# Patient Record
Sex: Male | Born: 1980 | Race: White | Hispanic: No | Marital: Married | Smoking: Never smoker
Health system: Southern US, Community
[De-identification: ages and names within clinical notes are randomized; demographics above are authoritative.]

---
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
## 2003-08-15 ENCOUNTER — Emergency Department (HOSPITAL_COMMUNITY): Admission: EM | Admit: 2003-08-15 | Discharge: 2003-08-16 | Payer: Self-pay | Admitting: Emergency Medicine

## 2003-08-16 ENCOUNTER — Encounter: Payer: Self-pay | Admitting: Emergency Medicine

## 2003-08-18 ENCOUNTER — Ambulatory Visit (HOSPITAL_BASED_OUTPATIENT_CLINIC_OR_DEPARTMENT_OTHER): Admission: RE | Admit: 2003-08-18 | Discharge: 2003-08-18 | Payer: Self-pay | Admitting: Orthopedic Surgery

## 2004-03-31 ENCOUNTER — Emergency Department (HOSPITAL_COMMUNITY): Admission: EM | Admit: 2004-03-31 | Discharge: 2004-03-31 | Payer: Self-pay | Admitting: Emergency Medicine

## 2015-04-14 ENCOUNTER — Emergency Department (HOSPITAL_COMMUNITY): Payer: BC Managed Care – PPO

## 2015-04-14 ENCOUNTER — Encounter (HOSPITAL_COMMUNITY): Payer: Self-pay | Admitting: *Deleted

## 2015-04-14 ENCOUNTER — Emergency Department (HOSPITAL_COMMUNITY)
Admission: EM | Admit: 2015-04-14 | Discharge: 2015-04-14 | Disposition: A | Discharge status: Home / Self Care | Payer: BC Managed Care – PPO | Attending: Emergency Medicine | Admitting: Emergency Medicine

## 2015-04-14 DIAGNOSIS — R002 Palpitations: Secondary | ICD-10-CM | POA: Insufficient documentation

## 2015-04-14 DIAGNOSIS — R079 Chest pain, unspecified: Secondary | ICD-10-CM | POA: Diagnosis present

## 2015-04-14 LAB — BASIC METABOLIC PANEL
Anion gap: 8 (ref 5–15)
BUN: 11 mg/dL (ref 6–23)
CALCIUM: 9.2 mg/dL (ref 8.4–10.5)
CO2: 27 mmol/L (ref 19–32)
CREATININE: 1.19 mg/dL (ref 0.50–1.35)
Chloride: 104 mmol/L (ref 96–112)
GFR calc Af Amer: >90 mL/min (ref >90)
GFR calc non Af Amer: 79 mL/min — ABNORMAL LOW (ref >90)
GLUCOSE: 133 mg/dL — AB (ref 70–99)
POTASSIUM: 3.6 mmol/L (ref 3.5–5.1)
SODIUM: 139 mmol/L (ref 135–145)

## 2015-04-14 LAB — CBC
HEMATOCRIT: 44.7 % (ref 39.0–52.0)
HEMOGLOBIN: 15.1 g/dL (ref 13.0–17.0)
MCH: 28.2 pg (ref 26.0–34.0)
MCHC: 33.8 g/dL (ref 30.0–36.0)
MCV: 83.4 fL (ref 78.0–100.0)
Platelets: 188 10*3/uL (ref 150–400)
RBC: 5.36 MIL/uL (ref 4.22–5.81)
RDW: 13.7 % (ref 11.5–15.5)
WBC: 8 10*3/uL (ref 4.0–10.5)

## 2015-04-14 LAB — I-STAT TROPONIN, ED: TROPONIN I, POC: 0 ng/mL (ref 0.00–0.08)

## 2015-04-14 NOTE — ED Notes (Signed)
Pt in c/o palpitations and chest tightness since Monday, states symptoms have been going on since his father passed away Monday evening, denies other complaints, no distress noted

## 2015-04-14 NOTE — ED Notes (Signed)
Pt verbalized understanding of d/c instructions and has no further questions.  

## 2015-04-14 NOTE — ED Provider Notes (Signed)
CSN: [id number]     Arrival date & time 04/14/15  [id number] History   First MD Initiated Contact with Patient 04/14/15 0557     Chief Complaint  Patient presents with  . Chest Pain     (Consider location/radiation/quality/duration/timing/severity/associated sxs/prior Treatment) Patient is a 34 y.o. male presenting with chest pain. The history is provided by the patient.  Chest Pain He actually came in because he felt his heart racing. This occurred while he was lying in bed and lasted about 45 minutes ending just about the time he arrived in the ED. He did have a brief episode of a very mild discomfort in the right parasternal area, and he rated that pain at 1/10. There is no dyspnea, nausea, diaphoresis. He did have a fair amount of stress 2 days ago with his father dying and the drank a fifth of liquor that night. He is not in the alcohol since then and is a nonsmoker. He states that his father did have heart disease which started when he was in his mid 3630s. There is no known history of diabetes, hyperlipidemia, hypertension.  History reviewed. No pertinent past medical history. History reviewed. No pertinent past surgical history. History reviewed. No pertinent family history. History  Substance Use Topics  . Smoking status: Never Smoker   . Smokeless tobacco: Not on file  . Alcohol Use: Not on file    Review of Systems  Cardiovascular: Positive for chest pain.  All other systems reviewed and are negative.     Allergies  Review of patient's allergies indicates no known allergies.  Home Medications   Prior to Admission medications   Not on File   BP 143/82 mmHg  Pulse 74  Temp(Src) 97.8 F (36.6 C) (Oral)  Resp 12  Wt 285 lb (129.275 kg)  SpO2 98% Physical Exam  Nursing note and vitals reviewed.  34 year old male, resting comfortably and in no acute distress. Vital signs are normal. Oxygen saturation is 100%, which is normal. Head is normocephalic and atraumatic.  PERRLA, EOMI. Oropharynx is clear. Neck is nontender and supple without adenopathy or JVD. Back is nontender and there is no CVA tenderness. Lungs are clear without rales, wheezes, or rhonchi. Chest is nontender. Heart has regular rate and rhythm without murmur. Abdomen is soft, flat, nontender without masses or hepatosplenomegaly and peristalsis is normoactive. Extremities have no cyanosis or edema, full range of motion is present. Skin is warm and dry without rash. Neurologic: Mental status is normal, cranial nerves are intact, there are no motor or sensory deficits.  ED Course  Procedures (including critical care time) Labs Review Results for orders placed or performed during the hospital encounter of 04/14/15  CBC  Result Value Ref Range   WBC 8.0 4.0 - 10.5 K/uL   RBC 5.36 4.22 - 5.81 MIL/uL   Hemoglobin 15.1 13.0 - 17.0 g/dL   HCT 11.944.7 14.739.0 - 82.952.0 %   MCV 83.4 78.0 - 100.0 fL   MCH 28.2 26.0 - 34.0 pg   MCHC 33.8 30.0 - 36.0 g/dL   RDW 56.213.7 13.011.5 - 86.515.5 %   Platelets 188 150 - 400 K/uL  Basic metabolic panel  Result Value Ref Range   Sodium 139 135 - 145 mmol/L   Potassium 3.6 3.5 - 5.1 mmol/L   Chloride 104 96 - 112 mmol/L   CO2 27 19 - 32 mmol/L   Glucose, Bld 133 (H) 70 - 99 mg/dL   BUN 11 6 - 23  mg/dL   Creatinine, Ser 9.14 0.50 - 1.35 mg/dL   Calcium 9.2 8.4 - 78.2 mg/dL   GFR calc non Af Amer 79 (L) >90 mL/min   GFR calc Af Amer >90 >90 mL/min   Anion gap 8 5 - 15  I-stat troponin, ED (not at [facility])  Result Value Ref Range   Troponin i, poc 0.00 0.00 - 0.08 ng/mL   Comment 3           Imaging Review Dg Chest 2 View  04/14/2015   CLINICAL DATA:  Chest tightness and pressure, with shortness of breath, acute onset. Initial encounter.  EXAM: CHEST  2 VIEW  COMPARISON:  None.  FINDINGS: The lungs are well-aerated and clear. There is no evidence of focal opacification, pleural effusion or pneumothorax.  The heart is normal in size; the mediastinal contour is within  normal limits. No acute osseous abnormalities are seen.  IMPRESSION: No acute cardiopulmonary process seen.   Electronically Signed   By: Roanna Raider M.D.   On: 04/14/2015 03:50     EKG Interpretation   Date/Time:  Wednesday April 14 2015 03:15:44 EDT Ventricular Rate:  85 PR Interval:  160 QRS Duration: 98 QT Interval:  362 QTC Calculation: 430 R Axis:   80 Text Interpretation:  Normal sinus rhythm with sinus arrhythmia Normal ECG  No old tracing to compare Confirmed by [facility]  MD, Wilbert Schouten ([id number]) on  04/14/2015 5:58:20 AM      MDM   Final diagnoses:  Palpitations    Palpitations of uncertain cause. ECG and monitor have only shown normal sinus rhythm. With recent alcohol consumption, consider possibility of holiday heart syndrome. He also may have had an episode of paroxysmal supraventricular tachycardia. Based on family history of premature coronary atherosclerosis, he does need to pay attention to risk factors and I have talked with him about weight reduction and exercise. No further investigation of palpitations is warranted currently. He is noted to have elevated blood glucose and this will need to be followed as an outpatient. He does not have a PCP and he is urged to get one so that he can have other risk factors such as lipid profile checked.    Dione Booze, MD 04/14/15 [id number]

## 2015-04-14 NOTE — Discharge Instructions (Signed)
Make an appointment for a physical. You need to have your cholesterol and triglycerides checked. Also, your blood sugar was slightly elevated today (133). This needs to be watched to make sure you do not develop diabetes.  Palpitations A palpitation is the feeling that your heartbeat is irregular or is faster than normal. It may feel like your heart is fluttering or skipping a beat. Palpitations are usually not a serious problem. However, in some cases, you may need further medical evaluation. CAUSES  Palpitations can be caused by:  Smoking.  Caffeine or other stimulants, such as diet pills or energy drinks.  Alcohol.  Stress and anxiety.  Strenuous physical activity.  Fatigue.  Certain medicines.  Heart disease, especially if you have a history of irregular heart rhythms (arrhythmias), such as atrial fibrillation, atrial flutter, or supraventricular tachycardia.  An improperly working pacemaker or defibrillator. DIAGNOSIS  To find the cause of your palpitations, your health care provider will take your medical history and perform a physical exam. Your health care provider may also have you take a test called an ambulatory electrocardiogram (ECG). An ECG records your heartbeat patterns over a 24-hour period. You may also have other tests, such as:  Transthoracic echocardiogram (TTE). During echocardiography, sound waves are used to evaluate how blood flows through your heart.  Transesophageal echocardiogram (TEE).  Cardiac monitoring. This allows your health care provider to monitor your heart rate and rhythm in real time.  Holter monitor. This is a portable device that records your heartbeat and can help diagnose heart arrhythmias. It allows your health care provider to track your heart activity for several days, if needed.  Stress tests by exercise or by giving medicine that makes the heart beat faster. TREATMENT  Treatment of palpitations depends on the cause of your symptoms  and can vary greatly. Most cases of palpitations do not require any treatment other than time, relaxation, and monitoring your symptoms. Other causes, such as atrial fibrillation, atrial flutter, or supraventricular tachycardia, usually require further treatment. HOME CARE INSTRUCTIONS   Avoid:  Caffeinated coffee, tea, soft drinks, diet pills, and energy drinks.  Chocolate.  Alcohol.  Stop smoking if you smoke.  Reduce your stress and anxiety. Things that can help you relax include:  A method of controlling things in your body, such as your heartbeats, with your mind (biofeedback).  Yoga.  Meditation.  Physical activity such as swimming, jogging, or walking.  Get plenty of rest and sleep. SEEK MEDICAL CARE IF:   You continue to have a fast or irregular heartbeat beyond 24 hours.  Your palpitations occur more often. SEEK IMMEDIATE MEDICAL CARE IF:  You have chest pain or shortness of breath.  You have a severe headache.  You feel dizzy or you faint. MAKE SURE YOU:  Understand these instructions.  Will watch your condition.  Will get help right away if you are not doing well or get worse. Document Released: 12/01/2000 Document Revised: 12/09/2013 Document Reviewed: 02/02/2012 [facility] Patient Information 2015 [place], [place]. This information is not intended to replace advice given to you by your health care provider. Make sure you discuss any questions you have with your health care provider.

## 2020-04-06 ENCOUNTER — Inpatient Hospital Stay (HOSPITAL_COMMUNITY)
Admission: EM | Admit: 2020-04-06 | Discharge: 2020-04-10 | DRG: 177 | Disposition: A | Discharge status: Home / Self Care | Payer: BC Managed Care – PPO | Source: Ambulatory Visit | Attending: Internal Medicine | Admitting: Internal Medicine

## 2020-04-06 ENCOUNTER — Encounter (HOSPITAL_COMMUNITY): Payer: Self-pay | Admitting: Emergency Medicine

## 2020-04-06 ENCOUNTER — Other Ambulatory Visit: Payer: Self-pay

## 2020-04-06 ENCOUNTER — Emergency Department (HOSPITAL_COMMUNITY): Payer: BC Managed Care – PPO

## 2020-04-06 DIAGNOSIS — J9601 Acute respiratory failure with hypoxia: Secondary | ICD-10-CM | POA: Diagnosis present

## 2020-04-06 DIAGNOSIS — Z6838 Body mass index (BMI) 38.0-38.9, adult: Secondary | ICD-10-CM

## 2020-04-06 DIAGNOSIS — E669 Obesity, unspecified: Secondary | ICD-10-CM | POA: Diagnosis not present

## 2020-04-06 DIAGNOSIS — U071 COVID-19: Secondary | ICD-10-CM | POA: Diagnosis present

## 2020-04-06 DIAGNOSIS — R7401 Elevation of levels of liver transaminase levels: Secondary | ICD-10-CM | POA: Diagnosis not present

## 2020-04-06 DIAGNOSIS — J1282 Pneumonia due to coronavirus disease 2019: Secondary | ICD-10-CM | POA: Diagnosis present

## 2020-04-06 HISTORY — DX: COVID-19: U07.1

## 2020-04-06 LAB — CBC WITH DIFFERENTIAL/PLATELET
Abs Immature Granulocytes: 0.05 10*3/uL (ref 0.00–0.07)
Basophils Absolute: 0 10*3/uL (ref 0.0–0.1)
Basophils Relative: 0 %
Eosinophils Absolute: 0 10*3/uL (ref 0.0–0.5)
Eosinophils Relative: 0 %
HCT: 43.8 % (ref 39.0–52.0)
Hemoglobin: 14.3 g/dL (ref 13.0–17.0)
Immature Granulocytes: 1 %
Lymphocytes Relative: 12 %
Lymphs Abs: 0.6 10*3/uL — ABNORMAL LOW (ref 0.7–4.0)
MCH: 27.2 pg (ref 26.0–34.0)
MCHC: 32.6 g/dL (ref 30.0–36.0)
MCV: 83.4 fL (ref 80.0–100.0)
Monocytes Absolute: 0.2 10*3/uL (ref 0.1–1.0)
Monocytes Relative: 4 %
Neutro Abs: 4.4 10*3/uL (ref 1.7–7.7)
Neutrophils Relative %: 83 %
Platelets: 190 10*3/uL (ref 150–400)
RBC: 5.25 MIL/uL (ref 4.22–5.81)
RDW: 13.7 % (ref 11.5–15.5)
WBC: 5.3 10*3/uL (ref 4.0–10.5)
nRBC: 0 % (ref 0.0–0.2)

## 2020-04-06 LAB — HEPATIC FUNCTION PANEL
ALT: 45 U/L — ABNORMAL HIGH (ref 0–44)
AST: 37 U/L (ref 15–41)
Albumin: 2.6 g/dL — ABNORMAL LOW (ref 3.5–5.0)
Alkaline Phosphatase: 60 U/L (ref 38–126)
Bilirubin, Direct: 0.3 mg/dL — ABNORMAL HIGH (ref 0.0–0.2)
Indirect Bilirubin: 0.5 mg/dL (ref 0.3–0.9)
Total Bilirubin: 0.8 mg/dL (ref 0.3–1.2)
Total Protein: 6.7 g/dL (ref 6.5–8.1)

## 2020-04-06 LAB — FIBRINOGEN: Fibrinogen: 689 mg/dL — ABNORMAL HIGH (ref 210–475)

## 2020-04-06 LAB — LACTATE DEHYDROGENASE: LDH: 409 U/L — ABNORMAL HIGH (ref 98–192)

## 2020-04-06 LAB — BASIC METABOLIC PANEL
Anion gap: 11 (ref 5–15)
BUN: 10 mg/dL (ref 6–20)
CO2: 25 mmol/L (ref 22–32)
Calcium: 8.3 mg/dL — ABNORMAL LOW (ref 8.9–10.3)
Chloride: 100 mmol/L (ref 98–111)
Creatinine, Ser: 0.85 mg/dL (ref 0.61–1.24)
GFR calc Af Amer: >60 mL/min (ref >60)
GFR calc non Af Amer: >60 mL/min (ref >60)
Glucose, Bld: 119 mg/dL — ABNORMAL HIGH (ref 70–99)
Potassium: 3.3 mmol/L — ABNORMAL LOW (ref 3.5–5.1)
Sodium: 136 mmol/L (ref 135–145)

## 2020-04-06 LAB — PROCALCITONIN: Procalcitonin: 0.12 ng/mL

## 2020-04-06 LAB — CBC
HCT: 45 % (ref 39.0–52.0)
Hemoglobin: 14.7 g/dL (ref 13.0–17.0)
MCH: 27.1 pg (ref 26.0–34.0)
MCHC: 32.7 g/dL (ref 30.0–36.0)
MCV: 83 fL (ref 80.0–100.0)
Platelets: 201 10*3/uL (ref 150–400)
RBC: 5.42 MIL/uL (ref 4.22–5.81)
RDW: 13.8 % (ref 11.5–15.5)
WBC: 5.7 10*3/uL (ref 4.0–10.5)
nRBC: 0 % (ref 0.0–0.2)

## 2020-04-06 LAB — HIV ANTIBODY (ROUTINE TESTING W REFLEX): HIV Screen 4th Generation wRfx: NONREACTIVE

## 2020-04-06 LAB — D-DIMER, QUANTITATIVE: D-Dimer, Quant: 1.09 ug/mL-FEU — ABNORMAL HIGH (ref 0.00–0.50)

## 2020-04-06 LAB — FERRITIN: Ferritin: 1378 ng/mL — ABNORMAL HIGH (ref 24–336)

## 2020-04-06 LAB — POC SARS CORONAVIRUS 2 AG -  ED: SARS Coronavirus 2 Ag: NEGATIVE

## 2020-04-06 LAB — LACTIC ACID, PLASMA: Lactic Acid, Venous: 1.6 mmol/L (ref 0.5–1.9)

## 2020-04-06 LAB — C-REACTIVE PROTEIN: CRP: 10.1 mg/dL — ABNORMAL HIGH (ref <1.0)

## 2020-04-06 MED ORDER — BISACODYL 5 MG PO TBEC: 5.0000 mg | DELAYED_RELEASE_TABLET | Freq: Every day | ORAL | Status: DC | PRN

## 2020-04-06 MED ORDER — GUAIFENESIN-DM 100-10 MG/5ML PO SYRP
10.0000 mL | ORAL_SOLUTION | ORAL | Status: DC | PRN
  Administered 2020-04-07: 10 mL via ORAL
  Filled 2020-04-06 (×3): qty 10

## 2020-04-06 MED ORDER — ENOXAPARIN SODIUM 40 MG/0.4ML ~~LOC~~ SOLN
40.0000 mg | SUBCUTANEOUS | Status: DC
  Administered 2020-04-06: 16:00:00 40 mg via SUBCUTANEOUS
  Filled 2020-04-06: qty 0.4

## 2020-04-06 MED ORDER — SODIUM CHLORIDE 0.9% FLUSH
3.0000 mL | Freq: Two times a day (BID) | INTRAVENOUS | Status: DC
  Administered 2020-04-06 – 2020-04-09 (×7): 3 mL via INTRAVENOUS

## 2020-04-06 MED ORDER — DEXAMETHASONE SODIUM PHOSPHATE 10 MG/ML IJ SOLN
6.0000 mg | INTRAMUSCULAR | Status: DC
  Administered 2020-04-06: 16:00:00 6 mg via INTRAVENOUS
  Filled 2020-04-06: qty 1

## 2020-04-06 MED ORDER — SODIUM CHLORIDE 0.9 % IV SOLN: 250.0000 mL | INTRAVENOUS | Status: DC | PRN

## 2020-04-06 MED ORDER — ALBUTEROL SULFATE HFA 108 (90 BASE) MCG/ACT IN AERS
2.0000 | INHALATION_SPRAY | RESPIRATORY_TRACT | Status: DC | PRN
  Administered 2020-04-06 – 2020-04-08 (×4): 2 via RESPIRATORY_TRACT
  Filled 2020-04-06: qty 6.7

## 2020-04-06 MED ORDER — HYDROCOD POLST-CPM POLST ER 10-8 MG/5ML PO SUER
5.0000 mL | Freq: Two times a day (BID) | ORAL | Status: DC | PRN
  Administered 2020-04-07: 5 mL via ORAL
  Filled 2020-04-06: qty 5

## 2020-04-06 MED ORDER — ONDANSETRON HCL 4 MG/2ML IJ SOLN: 4.0000 mg | Freq: Four times a day (QID) | INTRAMUSCULAR | Status: DC | PRN

## 2020-04-06 MED ORDER — SODIUM CHLORIDE 0.9 % IV SOLN
100.0000 mg | Freq: Every day | INTRAVENOUS | Status: AC
  Administered 2020-04-07 – 2020-04-10 (×4): 100 mg via INTRAVENOUS
  Filled 2020-04-06 (×4): qty 20

## 2020-04-06 MED ORDER — ACETAMINOPHEN 325 MG PO TABS
650.0000 mg | ORAL_TABLET | Freq: Four times a day (QID) | ORAL | Status: DC | PRN
  Administered 2020-04-06: 650 mg via ORAL
  Filled 2020-04-06: qty 2

## 2020-04-06 MED ORDER — OXYCODONE HCL 5 MG PO TABS: 5.0000 mg | ORAL_TABLET | ORAL | Status: DC | PRN

## 2020-04-06 MED ORDER — POLYETHYLENE GLYCOL 3350 17 G PO PACK: 17.0000 g | PACK | Freq: Every day | ORAL | Status: DC | PRN

## 2020-04-06 MED ORDER — SODIUM CHLORIDE 0.9% FLUSH
3.0000 mL | INTRAVENOUS | Status: DC | PRN
  Administered 2020-04-06: 16:00:00 3 mL via INTRAVENOUS

## 2020-04-06 MED ORDER — SODIUM CHLORIDE 0.9% FLUSH
3.0000 mL | Freq: Two times a day (BID) | INTRAVENOUS | Status: DC
  Administered 2020-04-06 – 2020-04-09 (×8): 3 mL via INTRAVENOUS

## 2020-04-06 MED ORDER — ONDANSETRON HCL 4 MG PO TABS: 4.0000 mg | ORAL_TABLET | Freq: Four times a day (QID) | ORAL | Status: DC | PRN

## 2020-04-06 MED ORDER — FLEET ENEMA 7-19 GM/118ML RE ENEM: 1.0000 | ENEMA | Freq: Once | RECTAL | Status: DC | PRN

## 2020-04-06 MED ORDER — SODIUM CHLORIDE 0.9 % IV SOLN
200.0000 mg | Freq: Once | INTRAVENOUS | Status: AC
  Administered 2020-04-06: 18:00:00 200 mg via INTRAVENOUS
  Filled 2020-04-06: qty 40

## 2020-04-06 NOTE — Plan of Care (Signed)

## 2020-04-06 NOTE — ED Notes (Signed)
Dr. Ophelia Charter in to eval pt.  No gross changes noted.

## 2020-04-06 NOTE — H&P (Signed)
History and Physical    Jeremy Miles QHU:[id number] DOB: 12-01-1981 DOA: 04/06/2020  PCP: Jeremy Rakes, MD Consultants:  None Patient coming from:  Home - lives with wife and 2 children; NOK: Wife, Jeremy Miles, [phone]  Chief Complaint:  SOB  HPI: Jeremy Miles is a 39 y.o. male with no significant medical history presenting with worsening COVID symptoms.  He reports initial onset of symptoms with fever of 102.8 on 4/11.  He went the following day for COVID testing at a drive-through [facility] and was reported positive on 4/14.  All of last week, he had a severe headache behind his eyes.  The headache eased last weekend but his fever has spiked intermittently throughout.  He thought he was doing better but his wife noted SOB and a crackling noise with his overnight breathing the last few nights.  He was supposed to be released from quarantine on 4/22 but his wife made him go to Urgent Care today.  There, his sats were in the 80s and they sent him to the ER.  He reports that he has not really worn a mask for a year now and that "this is all just so stupid."  His wife reports that he was the first one in their family to get COVID and he is the sickest - fevers, chills, coughing, getting choked, dry mouth.  She had to help him get dressed after a shower because he was so out of breath.  The last few days, his breathing was getting worse at just the littlest things.    ED Course:  COVID + Thursday and increasing SOB since.  High 80s at UC, placed on 4L.  Can't wean down.  Review of Systems: As per HPI; otherwise review of systems reviewed and negative.   Ambulatory Status:  Ambulates without assistance  COVID Vaccine Status:  None   Past Medical History:  Diagnosis Date  . Acute hypoxemic respiratory failure due to COVID-19 [facility]) 04/06/2020    History reviewed. No pertinent surgical history.  Social History   Socioeconomic History  . Marital status: Married    Spouse name: Not on  file  . Number of children: Not on file  . Years of education: Not on file  . Highest education level: Not on file  Occupational History  . Occupation: high Wellsite geologist  Tobacco Use  . Smoking status: Never Smoker  . Smokeless tobacco: Never Used  Substance and Sexual Activity  . Alcohol use: Not Currently  . Drug use: Never  . Sexual activity: Not on file  Other Topics Concern  . Not on file  Social History Narrative  . Not on file   Social Determinants of Health   Financial Resource Strain:   . Difficulty of Paying Living Expenses:   Food Insecurity:   . Worried About Programme researcher, broadcasting/film/video in the Last Year:   . Barista in the Last Year:   Transportation Needs:   . Freight forwarder (Medical):   Marland Kitchen Lack of Transportation (Non-Medical):   Physical Activity:   . Days of Exercise per Week:   . Minutes of Exercise per Session:   Stress:   . Feeling of Stress :   Social Connections:   . Frequency of Communication with Friends and Family:   . Frequency of Social Gatherings with Friends and Family:   . Attends Religious Services:   . Active Member of Clubs or Organizations:   . Attends Club or  Organization Meetings:   Marland Kitchen Marital Status:   Intimate Partner Violence:   . Fear of Current or Ex-Partner:   . Emotionally Abused:   Marland Kitchen Physically Abused:   . Sexually Abused:     No Known Allergies  History reviewed. No pertinent family history.  Prior to Admission medications   Not on File    Physical Exam: Vitals:   04/06/20 1345 04/06/20 1400 04/06/20 1415 04/06/20 1445  BP: 117/86 119/82 115/79 109/76  Pulse: 92 (!) 102 (!) 103 99  Resp: (!) 29 (!) 25 (!) 37 (!) 37  Temp:      TempSrc:      SpO2: 91% 94% (!) 87% 94%  Weight:      Height:         . General:  Appears calm and comfortable and is NAD; somewhat frustrated . Eyes:  PERRL, EOMI, normal lids, iris . ENT:  grossly normal hearing, lips & tongue, mmm; appropriate dentition . Neck:  no  LAD, masses or thyromegaly . Cardiovascular:  RR with mild tachycardia, no m/r/g. No LE edema.  Marland Kitchen Respiratory:   CTA bilaterally with no wheezes/rales/rhonchi, diminished breath sounds in bases.  Mildly increased respiratory effort on 4L [place] O2 with sat 93% . Abdomen:  soft, NT, ND, NABS . Skin:  no rash or induration seen on limited exam . Musculoskeletal:  grossly normal tone BUE/BLE, good ROM, no bony abnormality . Psychiatric:  grossly normal mood and affect, speech fluent and appropriate, AOx3 . Neurologic:  CN 2-12 grossly intact, moves all extremities in coordinated fashion    Radiological Exams on Admission: DG Chest Portable 1 View  Result Date: 04/06/2020 CLINICAL DATA:  Shortness of breath. Positive COVID-19 EXAM: PORTABLE CHEST 1 VIEW COMPARISON:  04/14/2015 FINDINGS: The heart size and mediastinal contours are within normal limits. Low lung volumes. Extensive multifocal bilateral airspace opacities. No large pleural fluid collection or pneumothorax. The visualized skeletal structures are unremarkable. IMPRESSION: Extensive multifocal bilateral airspace opacities compatible with multifocal pneumonia in the setting of COVID-19 infection. Electronically Signed   By: Duanne Guess D.O.   On: 04/06/2020 13:58    EKG: Independently reviewed.  Sinus tachycardia with rate 120; nonspecific ST changes with no evidence of acute ischemia   Labs on Admission: I have personally reviewed the available labs and imaging studies at the time of the admission.  Pertinent labs:   K+ 3.3 Glucose 119 Lactate 1.6 Normal CBC, +lymphopenia POC COVID negative Albumin 2.6 AST 37/ALT 45 LDH 409 Ferritin 1378 CRP 10.1 D-dimer 1.09 Fibrinogen 689 Procalcitonin is pending  COVID [facility]12[facility]:       Assessment/Plan Active Problems:   Acute hypoxemic respiratory failure due to COVID-19 (HCC)   Obesity (BMI 35.0-39.9 without comorbidity)   Acute respiratory failure  with hypoxia -Patient with COVID diagnosed about 10 days ago presenting with SOB and hypoxia to 80s -He does not have a usual home O2 requirement and is currently requiring 4L [place] O2 -COVID POSITIVE -The patient has comorbidities which may increase the risk for ARDS/MODS including:  Obesity -Exam is concerning for development of ARDS/MODS due to respiratory distress, inability to wean O2 below 4L -Pertinent labs concerning for COVID include lymphopenia; mildly increased LFTs; increased LDH; markedly elevated D-dimer (>1); increased ferritin; markedly elevated CRP (>7); increased fibrinogen -CXR with multifocal opacities which may be c/w COVID vs. Multifocal PNA -Will treat with broad-spectrum antibiotics given procalcitonin >0.1.   -Will not treat with broad-spectrum antibiotics  given procalcitonin <0.1 -Will admit for further evaluation, close monitoring, and treatment -Monitor on telemetry x at least 24 hours -At this time, will attempt to avoid use of aerosolized medications and use HFAs instead -Will check daily labs including BMP with Mag, Phos; LFTs; CBC with differential; CRP; ferritin; fibrinogen; D-dimer -Will order steroids and Remdesivir (pharmacy consult) given +COVID test, +CXR, and hypoxia <94% on room air -If the patient shows clinical deterioration, consider transfer to ICU with PCCM consultation -Consider Tocilizumab and/or convalescent plasma if the patient does not stabilize on current treatment or if the patient has marked clinical decompensation; the patient does not appear to require these treatments at this time. -Will attempt to maintain euvolemia to a net negative fluid status -Will ask the patient to maintain an awake prone position for 16+ hours a day, if possible, with a minimum of 2-3 hours at a time -With D-dimer <5, will use standard-dosed Lovenox for DVT prevention -Patient was seen wearing full PPE including: gown, gloves, head cover, N95, and face shield; donning  and doffing was in compliance with current standards.  Obesity -Body mass index is 38.49 kg/m. -Weight loss should be encouraged -Outpatient PCP/bariatric medicine/bariatric surgery f/u encouraged     DVT prophylaxis:  Lovenox  Code Status:  Full - confirmed with patient Family Communication: None present; I spoke with the patient's wife by telephone. Disposition Plan:  The patient is from: home  Anticipated d/c is to: home without [facility] services once his respiratory issues have been resolved.  He may require home O2 at the time of discharge.  Anticipated d/c date will depend on clinical response to treatment, likely between 3 days (with completion of outpatient Remdesivir treatment) and 5 days  Patient is currently: acutely ill Consults called: None  Admission status: Admit - It is my clinical opinion that admission to INPATIENT is reasonable and necessary because of the expectation that this patient will require hospital care that crosses at least 2 midnights to treat this condition based on the medical complexity of the problems presented.  Given the aforementioned information, the predictability of an adverse outcome is felt to be significant.    Karmen Bongo MD [facility]   How to contact the [facility] Attending or Consulting provider [place] or covering provider during after hours [place], for this patient?  1. Check the care team in [facility] and look for a) attending/consulting [facility] provider listed and b) the [facility] team listed 2. Log into [email or web address] and use [facility]'s universal password to access. If you do not have the password, please contact the hospital operator. 3. Locate the [facility] provider you are looking for under [facility] and page to a number that you can be directly reached. 4. If you still have difficulty reaching the provider, please page the [facility] (Director on Call) for the Hospitalists listed on amion for assistance.   04/06/2020, 5:36 PM

## 2020-04-06 NOTE — ED Notes (Signed)
Pt unable to tolerate at 2L.  Sats decreased to mid 80's without acute resp changes.  Pt updated on POC and oxygen returned to 5 L/[place].

## 2020-04-06 NOTE — ED Provider Notes (Signed)
[facility] EMERGENCY DEPARTMENT Provider Note   CSN: [id number] Arrival date & time: 04/06/20  1105     History Chief Complaint  Patient presents with  . Shortness of Breath    Jeremy Miles is a 39 y.o. male.  HPI 39 year old male with no significant medical history presents to the ER for low oxygen sats and increased need for supplemental O2 from urgent care.  History provided by patient.  Patient began to be symptomatic last Sunday, tested positive for Covid at [facility] on Thursday.  He has been having increasing shortness of breath, especially with movement.  Patient states that his wife made him go to urgent care for this.  According to triage notes, patient sats were in the high 80s, placed on 4 L nasal cannula and sats increased to 92%.  Patient does not have any history of respiratory problems in the past.  He endorses weakness and dizziness.  He denies chest pain, hemoptysis, syncope.     Past Medical History:  Diagnosis Date  . Acute hypoxemic respiratory failure due to COVID-19 [facility]) 04/06/2020    Patient Active Problem List   Diagnosis Date Noted  . Acute hypoxemic respiratory failure due to COVID-19 [facility]) 04/06/2020    History reviewed. No pertinent surgical history.     No family history on file.  Social History   Tobacco Use  . Smoking status: Never Smoker  Substance Use Topics  . Alcohol use: Not on file  . Drug use: Not on file    Home Medications Prior to Admission medications   Not on File    Allergies    Patient has no known allergies.  Review of Systems   Review of Systems  Constitutional: Negative for chills and fever.  HENT: Negative for ear pain and sore throat.   Eyes: Negative for pain and visual disturbance.  Respiratory: Positive for cough and shortness of breath.   Cardiovascular: Negative for chest pain and palpitations.  Gastrointestinal: Negative for abdominal pain and vomiting.  Genitourinary: Negative for  dysuria and hematuria.  Musculoskeletal: Negative for arthralgias and back pain.  Skin: Negative for color change and rash.  Neurological: Positive for weakness and headaches. Negative for seizures and syncope.  All other systems reviewed and are negative.   Physical Exam Updated Vital Signs BP 106/72   Pulse (!) 105   Temp 98.4 F (36.9 C) (Oral)   Resp (!) 30   Ht 5\' 11"  (1.803 m)   Wt 125.2 kg   SpO2 95%   BMI 38.49 kg/m   Physical Exam Vitals and nursing note reviewed.  Constitutional:      Appearance: He is well-developed. He is obese. He is ill-appearing.     Interventions: He is not intubated. HENT:     Head: Normocephalic and atraumatic.     Mouth/Throat:     Mouth: Mucous membranes are moist.  Eyes:     Extraocular Movements: Extraocular movements intact.     Conjunctiva/sclera: Conjunctivae normal.     Pupils: Pupils are equal, round, and reactive to light.  Cardiovascular:     Rate and Rhythm: Normal rate and regular rhythm.     Heart sounds: No murmur.  Pulmonary:     Effort: Tachypnea, accessory muscle usage and respiratory distress present. No bradypnea. He is not intubated.     Breath sounds: Normal breath sounds. No stridor. No decreased breath sounds, wheezing, rhonchi or rales.  Chest:     Chest wall: No tenderness.  Abdominal:     General: Bowel sounds are normal.     Palpations: Abdomen is soft.     Tenderness: There is no abdominal tenderness.  Musculoskeletal:     Cervical back: Neck supple.     Right lower leg: No tenderness. No edema.     Left lower leg: No tenderness. No edema.  Skin:    General: Skin is warm and dry.     Coloration: Skin is not cyanotic or pale.  Neurological:     General: No focal deficit present.     Mental Status: He is alert.  Psychiatric:        Mood and Affect: Mood normal.        Behavior: Behavior normal.     ED Results / Procedures / Treatments   Labs (all labs ordered are listed, but only abnormal  results are displayed) Labs Reviewed  BASIC METABOLIC PANEL - Abnormal; Notable for the following components:      Result Value   Potassium 3.3 (*)    Glucose, Bld 119 (*)    Calcium 8.3 (*)    All other components within normal limits  SARS CORONAVIRUS 2 (TAT 6-24 HRS)  CBC  LACTIC ACID, PLASMA  LACTIC ACID, PLASMA    EKG None  Radiology DG Chest Portable 1 View  Result Date: 04/06/2020 CLINICAL DATA:  Shortness of breath. Positive COVID-19 EXAM: PORTABLE CHEST 1 VIEW COMPARISON:  04/14/2015 FINDINGS: The heart size and mediastinal contours are within normal limits. Low lung volumes. Extensive multifocal bilateral airspace opacities. No large pleural fluid collection or pneumothorax. The visualized skeletal structures are unremarkable. IMPRESSION: Extensive multifocal bilateral airspace opacities compatible with multifocal pneumonia in the setting of COVID-19 infection. Electronically Signed   By: Davina Poke D.O.   On: 04/06/2020 13:58    Procedures .Critical Care Performed by: Garald Balding, PA-C Authorized by: Garald Balding, PA-C   Critical care provider statement:    Critical care time (minutes):  35   Critical care was necessary to treat or prevent imminent or life-threatening deterioration of the following conditions:  Respiratory failure   Critical care was time spent personally by me on the following activities:  Discussions with consultants, evaluation of patient's response to treatment, examination of patient, ordering and performing treatments and interventions, ordering and review of laboratory studies, ordering and review of radiographic studies, pulse oximetry, re-evaluation of patient's condition, obtaining history from patient or surrogate, review of old charts and development of treatment plan with patient or surrogate   I assumed direction of critical care for this patient from another provider in my specialty: no     (including critical care  time)  Medications Ordered in ED Medications - No data to display  ED Course  I have reviewed the triage vital signs and the nursing notes.  Pertinent labs & imaging results that were available during my care of the patient were reviewed by me and considered in my medical decision making (see chart for details).    MDM Rules/Calculators/A&P                       39 year old male tested positive for Covid on Thursday, presents with worsening O2 sats and increased oxygen requirements from urgent care. Presentation the ER, patient is ill-appearing, visibly tachypneic with slight accessory muscle usage.  Patient is tachycardic, tachypneic.Afebrile, normotensive.  Present presenting to the ER on 5 L of oxygen, sats 95%.  Nursing staff attempted to  decrease to 2 L, patient sats dropped to 87%.  Chest x-ray with extensive multifocal bilateral airspace opacities compatible with multifocal pneumonia in the setting of COVID-19.  CBC without leukocytosis, normal hemoglobin.  Initial lactic normal. BMP without significant electrolyte abnormalities, no kidney dysfunction.  Given respiratory failure with hypoxia in the setting of COVID-19, will consult hospitalist for admission.  Discussed with hospitalist team, patient will be admitted for further monitoring and treatment.   IMPRESSION:  Extensive multifocal bilateral airspace opacities compatible with  multifocal pneumonia in the setting of COVID-19 infection.      Final Clinical Impression(s) / ED Diagnoses Final diagnoses:  COVID-19  Acute respiratory failure with hypoxia [facility])    Rx / DC Orders ED Discharge Orders    None       Mare Ferrari, PA-C 04/06/20 1446    Mancel Bale, MD 04/07/20 [phone]

## 2020-04-06 NOTE — ED Triage Notes (Signed)
Pt sent from UC with low sats. Covid + last Monday. RA sats 85%, placed on 4L to get O2 sats to 91%.

## 2020-04-06 NOTE — ED Notes (Signed)
States tested 8 days ago with positive result.  Pt in NAD at this time.  Oxygen at 5 L/[place], turned down to 2L

## 2020-04-07 DIAGNOSIS — R7401 Elevation of levels of liver transaminase levels: Secondary | ICD-10-CM

## 2020-04-07 LAB — COMPREHENSIVE METABOLIC PANEL
ALT: 53 U/L — ABNORMAL HIGH (ref 0–44)
AST: 45 U/L — ABNORMAL HIGH (ref 15–41)
Albumin: 2.3 g/dL — ABNORMAL LOW (ref 3.5–5.0)
Alkaline Phosphatase: 63 U/L (ref 38–126)
Anion gap: 9 (ref 5–15)
BUN: 13 mg/dL (ref 6–20)
CO2: 28 mmol/L (ref 22–32)
Calcium: 8.4 mg/dL — ABNORMAL LOW (ref 8.9–10.3)
Chloride: 102 mmol/L (ref 98–111)
Creatinine, Ser: 0.86 mg/dL (ref 0.61–1.24)
GFR calc Af Amer: >60 mL/min (ref >60)
GFR calc non Af Amer: >60 mL/min (ref >60)
Glucose, Bld: 137 mg/dL — ABNORMAL HIGH (ref 70–99)
Potassium: 3.7 mmol/L (ref 3.5–5.1)
Sodium: 139 mmol/L (ref 135–145)
Total Bilirubin: 0.9 mg/dL (ref 0.3–1.2)
Total Protein: 6.4 g/dL — ABNORMAL LOW (ref 6.5–8.1)

## 2020-04-07 LAB — HEMOGLOBIN A1C
Hgb A1c MFr Bld: 5.8 % — ABNORMAL HIGH (ref 4.8–5.6)
Mean Plasma Glucose: 119.76 mg/dL

## 2020-04-07 LAB — CBC WITH DIFFERENTIAL/PLATELET
Abs Immature Granulocytes: 0.07 10*3/uL (ref 0.00–0.07)
Basophils Absolute: 0 10*3/uL (ref 0.0–0.1)
Basophils Relative: 0 %
Eosinophils Absolute: 0 10*3/uL (ref 0.0–0.5)
Eosinophils Relative: 0 %
HCT: 42.6 % (ref 39.0–52.0)
Hemoglobin: 14.2 g/dL (ref 13.0–17.0)
Immature Granulocytes: 2 %
Lymphocytes Relative: 11 %
Lymphs Abs: 0.4 10*3/uL — ABNORMAL LOW (ref 0.7–4.0)
MCH: 27.4 pg (ref 26.0–34.0)
MCHC: 33.3 g/dL (ref 30.0–36.0)
MCV: 82.2 fL (ref 80.0–100.0)
Monocytes Absolute: 0.1 10*3/uL (ref 0.1–1.0)
Monocytes Relative: 4 %
Neutro Abs: 2.9 10*3/uL (ref 1.7–7.7)
Neutrophils Relative %: 83 %
Platelets: 217 10*3/uL (ref 150–400)
RBC: 5.18 MIL/uL (ref 4.22–5.81)
RDW: 13.4 % (ref 11.5–15.5)
WBC: 3.5 10*3/uL — ABNORMAL LOW (ref 4.0–10.5)
nRBC: 0 % (ref 0.0–0.2)

## 2020-04-07 LAB — D-DIMER, QUANTITATIVE: D-Dimer, Quant: 0.73 ug/mL-FEU — ABNORMAL HIGH (ref 0.00–0.50)

## 2020-04-07 LAB — MAGNESIUM: Magnesium: 2.4 mg/dL (ref 1.7–2.4)

## 2020-04-07 LAB — PHOSPHORUS: Phosphorus: 3.8 mg/dL (ref 2.5–4.6)

## 2020-04-07 LAB — C-REACTIVE PROTEIN: CRP: 11.4 mg/dL — ABNORMAL HIGH (ref <1.0)

## 2020-04-07 LAB — FERRITIN: Ferritin: 1443 ng/mL — ABNORMAL HIGH (ref 24–336)

## 2020-04-07 MED ORDER — SALINE SPRAY 0.65 % NA SOLN
1.0000 | NASAL | Status: DC | PRN
  Filled 2020-04-07: qty 44

## 2020-04-07 MED ORDER — TOCILIZUMAB 400 MG/20ML IV SOLN
800.0000 mg | Freq: Once | INTRAVENOUS | Status: AC
  Administered 2020-04-07: 800 mg via INTRAVENOUS
  Filled 2020-04-07: qty 40

## 2020-04-07 MED ORDER — METHYLPREDNISOLONE SODIUM SUCC 40 MG IJ SOLR
40.0000 mg | Freq: Two times a day (BID) | INTRAMUSCULAR | Status: DC
  Administered 2020-04-07 – 2020-04-09 (×6): 40 mg via INTRAVENOUS
  Filled 2020-04-07 (×6): qty 1

## 2020-04-07 MED ORDER — LIP MEDEX EX OINT
TOPICAL_OINTMENT | CUTANEOUS | Status: DC | PRN
  Filled 2020-04-07: qty 7

## 2020-04-07 MED ORDER — ENOXAPARIN SODIUM 60 MG/0.6ML ~~LOC~~ SOLN
60.0000 mg | SUBCUTANEOUS | Status: DC
  Administered 2020-04-07 – 2020-04-09 (×3): 60 mg via SUBCUTANEOUS
  Filled 2020-04-07 (×2): qty 0.6

## 2020-04-07 NOTE — Plan of Care (Signed)

## 2020-04-07 NOTE — Progress Notes (Signed)
tachypenic-upto 5-6L-consents to the use off label actemra.  The rationale for the off label use of Actemra its known side effects, potential benefits was  discussed with patient .The use of Actemra is based on published clinical articles/anecdotal data as several randomized trials have been negative, but lately there have been a few positive randomized trials as well.  There is no history of tuberculosis, hepatitis B or C.  Complete risks and long-term side effects are unknown, however in the best clinical judgment it is felt that the clinical benefit at this time outweighs medical risks given tenuous clinical state of the patient.  Patient agrees with the treatment plan and consent to the use of Actemra.

## 2020-04-07 NOTE — Progress Notes (Signed)
PROGRESS NOTE                                                                                                                                                                                                             Patient Demographics:    Jeremy Miles, is a 39 y.o. male, DOB - Aug 21, 1981, [id number]  Outpatient Primary MD for the patient is Charlott Rakes, MD   Admit date - 04/06/2020   LOS - 1  Chief Complaint  Patient presents with  . Shortness of Breath       Brief Narrative: Patient is a 39 y.o. male with no past medical history-diagnosed with COVID-19 on 4/12 who presented with acute hypoxic respiratory failure secondary to COVID-19 pneumonia  Significant Events: 4/12>> [facility]>> admit to [facility] for hypoxia secondary to COVID-19  COVID-19 medications: Steroids: 4/20>> Remdesivir: 4/20>> Actemra: 4/21 x 1  Antibiotics: None  Microbiology data: None  DVT prophylaxis: SQ Lovenox  Procedures: None  Consults: None    Subjective:    Jeremy Miles today appears to be slightly tachypneic after he talks a sentence. Oxygen requirements have worsened-he is now on 5-6 L of oxygen. He otherwise appears stable.   Assessment  & Plan :   Acute Hypoxic Resp Failure due to Covid 19 Viral pneumonia: Worsening hypoxemia and CRP-continue steroids and remdesivir-has consented to the off label use of Actemra. Continue supportive care-monitor closely.  Fever: afebrile  O2 requirements:  SpO2: 90 % O2 Flow Rate (L/min): 5 L/min   COVID-19 Labs: Recent Labs    04/06/20 1603 04/07/20 0459  DDIMER 1.09* 0.73*  FERRITIN 1,378* 1,443*  LDH 409*  --   CRP 10.1* 11.4*    No results found for: BNP  Recent Labs  Lab 04/06/20 1603  PROCALCITON 0.12    No results found for: SARSCOV2NAA   Prone/Incentive Spirometry: encouraged patient to lie prone for 3-4 hours at a time for a  total of 16 hours a day, and to encourage incentive spirometry use 3-4/hour.  Transaminitis: Mild-likely second to COVID-19-follow.  Morbid Obesity: Estimated body mass index is 38.49 kg/m as calculated from the following:   Height as of this encounter: 5\' 11"  (1.803 m).   Weight as of this encounter: 125.2 kg.     ABG: No results found for: PHART, PCO2ART, PO2ART,  HCO3, TCO2, ACIDBASEDEF, O2SAT  Vent Settings: N/A   Condition -Guarded  Family Communication  : Patient to update family himself-I have asked him to let me know if his wife has questions for me  Code Status :  Full Code  Diet :  Diet Order            Diet regular Room service appropriate? Yes; Fluid consistency: Thin  Diet effective now               Disposition Plan  :  Remain hospitalized  Barriers to discharge: Hypoxia requiring O2 supplementation/complete 5 days of IV Remdesivir  Antimicorbials  :    Anti-infectives (From admission, onward)   Start     Dose/Rate Route Frequency Ordered Stop   04/07/20 1000  remdesivir 100 mg in sodium chloride 0.9 % 100 mL IVPB     100 mg 200 mL/hr over 30 Minutes Intravenous Daily 04/06/20 1508 04/11/20 0959   04/06/20 1600  remdesivir 200 mg in sodium chloride 0.9% 250 mL IVPB     200 mg 580 mL/hr over 30 Minutes Intravenous Once 04/06/20 1508 04/06/20 1934      Inpatient Medications  Scheduled Meds: . enoxaparin (LOVENOX) injection  60 mg Subcutaneous Q24H  . methylPREDNISolone (SOLU-MEDROL) injection  40 mg Intravenous Q12H  . sodium chloride flush  3 mL Intravenous Q12H  . sodium chloride flush  3 mL Intravenous Q12H   Continuous Infusions: . sodium chloride    . remdesivir 100 mg in NS 100 mL 100 mg (04/07/20 0745)  . tocilizumab (ACTEMRA) - non-COVID treatment     PRN Meds:.sodium chloride, acetaminophen, albuterol, bisacodyl, chlorpheniramine-HYDROcodone, guaiFENesin-dextromethorphan, lip balm, ondansetron **OR** ondansetron (ZOFRAN) IV,  oxyCODONE, polyethylene glycol, sodium chloride, sodium chloride flush, sodium phosphate   Time Spent in minutes 35   See all Orders from today for further details   Jeoffrey Massed M.D on 04/07/2020 at 9:54 AM  To page go to [email or web address] - use universal password  [facility] -  Office  [phone]    Objective:   Vitals:   04/07/20 0004 04/07/20 0400 04/07/20 0401 04/07/20 0800  BP: 102/66 98/67  115/90  Pulse: 86 71 64 93  Resp: 17 (!) 26 (!) 26 (!) 21  Temp: 97.6 F (36.4 C) 97.8 F (36.6 C)  98 F (36.7 C)  TempSrc: Oral Oral  Oral  SpO2: 90% 90% 92% 90%  Weight:      Height:        Wt Readings from Last 3 Encounters:  04/06/20 125.2 kg  04/14/15 129.3 kg     Intake/Output Summary (Last 24 hours) at 04/07/2020 0954 Last data filed at 04/06/2020 2134 Gross per 24 hour  Intake 540 ml  Output --  Net 540 ml     Physical Exam Gen Exam:Alert awake-not in any distress HEENT:atraumatic, normocephalic Chest: B/L clear to auscultation anteriorly CVS:S1S2 regular Abdomen:soft non tender, non distended Extremities:no edema Neurology: Non focal Skin: no rash   Data Review:    CBC Recent Labs  Lab 04/06/20 1133 04/06/20 1603 04/07/20 0459  WBC 5.7 5.3 3.5*  HGB 14.7 14.3 14.2  HCT 45.0 43.8 42.6  PLT 201 190 217  MCV 83.0 83.4 82.2  MCH 27.1 27.2 27.4  MCHC 32.7 32.6 33.3  RDW 13.8 13.7 13.4  LYMPHSABS  --  0.6* 0.4*  MONOABS  --  0.2 0.1  EOSABS  --  0.0 0.0  BASOSABS  --  0.0 0.0    Chemistries  Recent Labs  Lab 04/06/20 1133 04/06/20 1603 04/07/20 0459  NA 136  --  139  K 3.3*  --  3.7  CL 100  --  102  CO2 25  --  28  GLUCOSE 119*  --  137*  BUN 10  --  13  CREATININE 0.85  --  0.86  CALCIUM 8.3*  --  8.4*  MG  --   --  2.4  AST  --  37 45*  ALT  --  45* 53*  ALKPHOS  --  60 63  BILITOT  --  0.8 0.9   ------------------------------------------------------------------------------------------------------------------ No  results for input(s): CHOL, HDL, LDLCALC, TRIG, CHOLHDL, LDLDIRECT in the last 72 hours.  Lab Results  Component Value Date   HGBA1C 5.8 (H) 04/07/2020   ------------------------------------------------------------------------------------------------------------------ No results for input(s): TSH, T4TOTAL, T3FREE, THYROIDAB in the last 72 hours.  Invalid input(s): FREET3 ------------------------------------------------------------------------------------------------------------------ Recent Labs    04/06/20 1603 04/07/20 0459  FERRITIN 1,378* 1,443*    Coagulation profile No results for input(s): INR, PROTIME in the last 168 hours.  Recent Labs    04/06/20 1603 04/07/20 0459  DDIMER 1.09* 0.73*    Cardiac Enzymes No results for input(s): CKMB, TROPONINI, MYOGLOBIN in the last 168 hours.  Invalid input(s): CK ------------------------------------------------------------------------------------------------------------------ No results found for: BNP  Micro Results No results found for this or any previous visit (from the past 240 hour(s)).  Radiology Reports DG Chest Portable 1 View  Result Date: 04/06/2020 CLINICAL DATA:  Shortness of breath. Positive COVID-19 EXAM: PORTABLE CHEST 1 VIEW COMPARISON:  04/14/2015 FINDINGS: The heart size and mediastinal contours are within normal limits. Low lung volumes. Extensive multifocal bilateral airspace opacities. No large pleural fluid collection or pneumothorax. The visualized skeletal structures are unremarkable. IMPRESSION: Extensive multifocal bilateral airspace opacities compatible with multifocal pneumonia in the setting of COVID-19 infection. Electronically Signed   By: Davina Poke D.O.   On: 04/06/2020 13:58

## 2020-04-08 LAB — CBC WITH DIFFERENTIAL/PLATELET
Abs Immature Granulocytes: 0.13 K/uL — ABNORMAL HIGH (ref 0.00–0.07)
Basophils Absolute: 0 K/uL (ref 0.0–0.1)
Basophils Relative: 0 %
Eosinophils Absolute: 0 K/uL (ref 0.0–0.5)
Eosinophils Relative: 0 %
HCT: 45.6 % (ref 39.0–52.0)
Hemoglobin: 14.8 g/dL (ref 13.0–17.0)
Immature Granulocytes: 1 %
Lymphocytes Relative: 6 %
Lymphs Abs: 0.6 K/uL — ABNORMAL LOW (ref 0.7–4.0)
MCH: 26.9 pg (ref 26.0–34.0)
MCHC: 32.5 g/dL (ref 30.0–36.0)
MCV: 82.9 fL (ref 80.0–100.0)
Monocytes Absolute: 0.4 K/uL (ref 0.1–1.0)
Monocytes Relative: 4 %
Neutro Abs: 8.3 K/uL — ABNORMAL HIGH (ref 1.7–7.7)
Neutrophils Relative %: 89 %
Platelets: 335 K/uL (ref 150–400)
RBC: 5.5 MIL/uL (ref 4.22–5.81)
RDW: 13.5 % (ref 11.5–15.5)
WBC: 9.4 K/uL (ref 4.0–10.5)
nRBC: 0 % (ref 0.0–0.2)

## 2020-04-08 LAB — COMPREHENSIVE METABOLIC PANEL
ALT: 60 U/L — ABNORMAL HIGH (ref 0–44)
AST: 33 U/L (ref 15–41)
Albumin: 2.6 g/dL — ABNORMAL LOW (ref 3.5–5.0)
Alkaline Phosphatase: 63 U/L (ref 38–126)
Anion gap: 12 (ref 5–15)
BUN: 19 mg/dL (ref 6–20)
CO2: 28 mmol/L (ref 22–32)
Calcium: 8.8 mg/dL — ABNORMAL LOW (ref 8.9–10.3)
Chloride: 103 mmol/L (ref 98–111)
Creatinine, Ser: 0.89 mg/dL (ref 0.61–1.24)
GFR calc Af Amer: >60 mL/min (ref >60)
GFR calc non Af Amer: >60 mL/min (ref >60)
Glucose, Bld: 135 mg/dL — ABNORMAL HIGH (ref 70–99)
Potassium: 3.8 mmol/L (ref 3.5–5.1)
Sodium: 143 mmol/L (ref 135–145)
Total Bilirubin: 0.2 mg/dL — ABNORMAL LOW (ref 0.3–1.2)
Total Protein: 6.7 g/dL (ref 6.5–8.1)

## 2020-04-08 LAB — C-REACTIVE PROTEIN: CRP: 5 mg/dL — ABNORMAL HIGH (ref <1.0)

## 2020-04-08 LAB — MAGNESIUM: Magnesium: 2.3 mg/dL (ref 1.7–2.4)

## 2020-04-08 LAB — FERRITIN: Ferritin: 1590 ng/mL — ABNORMAL HIGH (ref 24–336)

## 2020-04-08 LAB — D-DIMER, QUANTITATIVE: D-Dimer, Quant: 0.66 ug/mL-FEU — ABNORMAL HIGH (ref 0.00–0.50)

## 2020-04-08 LAB — PHOSPHORUS: Phosphorus: 4.6 mg/dL (ref 2.5–4.6)

## 2020-04-08 NOTE — Progress Notes (Signed)
PROGRESS NOTE                                                                                                                                                                                                             Patient Demographics:    Jeremy Miles, is a 39 y.o. male, DOB - 23-Sep-1981, ENI:[id number]  Outpatient Primary MD for the patient is Charlott Rakes, MD   Admit date - 04/06/2020   LOS - 2  Chief Complaint  Patient presents with  . Shortness of Breath       Brief Narrative: Patient is a 39 y.o. male with no past medical history-diagnosed with COVID-19 on 4/12 who presented with acute hypoxic respiratory failure secondary to COVID-19 pneumonia  Significant Events: 4/12>> [facility]>> admit to [facility] for hypoxia secondary to COVID-19  COVID-19 medications: Steroids: 4/20>> Remdesivir: 4/20>> Actemra: 4/21 x 1  Antibiotics: None  Microbiology data: None  DVT prophylaxis: SQ Lovenox  Procedures: None  Consults: None    Subjective:   Remains unchanged-still on 5 L of oxygen.  Did ambulate with nursing staff yesterday-was short of breath with ambulation.   Assessment  & Plan :   Acute Hypoxic Resp Failure due to Covid 19 Viral pneumonia: Still with significant hypoxemia-but stable on 5 L of oxygen.  He appears comfortable.  CRP is downtrending-continue steroids and remdesivir.  No signs of volume overload.  Have encouraged use of incentive spirometry/flutter valve-have asked nurse to see if he could prone for a few hours today.  Suspect that he has some amount of OSA at baseline that could be contributing to his hypoxemia-needs outpatient sleep study.    Fever: afebrile  O2 requirements:  SpO2: (!) 88 % O2 Flow Rate (L/min): 5 L/min   COVID-19 Labs: Recent Labs    04/06/20 1603 04/07/20 0459 04/08/20 0739  DDIMER 1.09* 0.73* 0.66*  FERRITIN 1,378* 1,443* 1,590*    LDH 409*  --   --   CRP 10.1* 11.4* 5.0*    No results found for: BNP  Recent Labs  Lab 04/06/20 1603  PROCALCITON 0.12    No results found for: SARSCOV2NAA   Prone/Incentive Spirometry: encouraged patient to lie prone for 3-4 hours at a time for a total of 16 hours a day, and to encourage incentive spirometry use 3-4/hour.  Transaminitis: Mild-likely second to COVID-19-follow.  Morbid Obesity: Estimated body mass index is 38.49 kg/m as calculated from the following:   Height as of this encounter: 5\' 11"  (1.803 m).   Weight as of this encounter: 125.2 kg.     ABG: No results found for: PHART, PCO2ART, PO2ART, HCO3, TCO2, ACIDBASEDEF, O2SAT  Vent Settings: N/A   Condition -Guarded  Family Communication  : Spoke with spouse over the phone on 4/22  Code Status :  Full Code  Diet :  Diet Order            Diet regular Room service appropriate? Yes; Fluid consistency: Thin  Diet effective now               Disposition Plan  :  Remain hospitalized  Barriers to discharge: Hypoxia requiring O2 supplementation/complete 5 days of IV Remdesivir  Antimicorbials  :    Anti-infectives (From admission, onward)   Start     Dose/Rate Route Frequency Ordered Stop   04/07/20 1000  remdesivir 100 mg in sodium chloride 0.9 % 100 mL IVPB     100 mg 200 mL/hr over 30 Minutes Intravenous Daily 04/06/20 1508 04/11/20 0959   04/06/20 1600  remdesivir 200 mg in sodium chloride 0.9% 250 mL IVPB     200 mg 580 mL/hr over 30 Minutes Intravenous Once 04/06/20 1508 04/06/20 1934      Inpatient Medications  Scheduled Meds: . enoxaparin (LOVENOX) injection  60 mg Subcutaneous Q24H  . methylPREDNISolone (SOLU-MEDROL) injection  40 mg Intravenous Q12H  . sodium chloride flush  3 mL Intravenous Q12H  . sodium chloride flush  3 mL Intravenous Q12H   Continuous Infusions: . sodium chloride    . remdesivir 100 mg in NS 100 mL 100 mg (04/08/20 0742)   PRN Meds:.sodium chloride,  acetaminophen, albuterol, bisacodyl, chlorpheniramine-HYDROcodone, guaiFENesin-dextromethorphan, lip balm, ondansetron **OR** ondansetron (ZOFRAN) IV, oxyCODONE, polyethylene glycol, sodium chloride, sodium chloride flush, sodium phosphate   Time Spent in minutes 35   See all Orders from today for further details   Oren Binet M.D on 04/08/2020 at 12:27 PM  To page go to [email or web address] - use universal password  [facility] -  Office  [phone]    Objective:   Vitals:   04/07/20 2136 04/08/20 0005 04/08/20 0442 04/08/20 0758  BP: 126/80 117/76 106/80   Pulse: 84   80  Resp:    (!) 22  Temp: (!) 97.5 F (36.4 C) 97.7 F (36.5 C) 98 F (36.7 C)   TempSrc: Oral     SpO2: 90%   (!) 88%  Weight:      Height:        Wt Readings from Last 3 Encounters:  04/06/20 125.2 kg  04/14/15 129.3 kg     Intake/Output Summary (Last 24 hours) at 04/08/2020 1227 Last data filed at 04/07/2020 1928 Gross per 24 hour  Intake 680 ml  Output --  Net 680 ml     Physical Exam Gen Exam:Alert awake-not in any distress HEENT:atraumatic, normocephalic Chest: B/L clear to auscultation anteriorly CVS:S1S2 regular Abdomen:soft non tender, non distended Extremities:no edema Neurology: Non focal Skin: no rash   Data Review:    CBC Recent Labs  Lab 04/06/20 1133 04/06/20 1603 04/07/20 0459 04/08/20 0739  WBC 5.7 5.3 3.5* 9.4  HGB 14.7 14.3 14.2 14.8  HCT 45.0 43.8 42.6 45.6  PLT 201 190 217 335  MCV 83.0 83.4 82.2 82.9  MCH 27.1 27.2 27.4 26.9  MCHC  32.7 32.6 33.3 32.5  RDW 13.8 13.7 13.4 13.5  LYMPHSABS  --  0.6* 0.4* 0.6*  MONOABS  --  0.2 0.1 0.4  EOSABS  --  0.0 0.0 0.0  BASOSABS  --  0.0 0.0 0.0    Chemistries  Recent Labs  Lab 04/06/20 1133 04/06/20 1603 04/07/20 0459 04/08/20 0739  NA 136  --  139 143  K 3.3*  --  3.7 3.8  CL 100  --  102 103  CO2 25  --  28 28  GLUCOSE 119*  --  137* 135*  BUN 10  --  13 19  CREATININE 0.85  --  0.86 0.89   CALCIUM 8.3*  --  8.4* 8.8*  MG  --   --  2.4 2.3  AST  --  37 45* 33  ALT  --  45* 53* 60*  ALKPHOS  --  60 63 63  BILITOT  --  0.8 0.9 0.2*   ------------------------------------------------------------------------------------------------------------------ No results for input(s): CHOL, HDL, LDLCALC, TRIG, CHOLHDL, LDLDIRECT in the last 72 hours.  Lab Results  Component Value Date   HGBA1C 5.8 (H) 04/07/2020   ------------------------------------------------------------------------------------------------------------------ No results for input(s): TSH, T4TOTAL, T3FREE, THYROIDAB in the last 72 hours.  Invalid input(s): FREET3 ------------------------------------------------------------------------------------------------------------------ Recent Labs    04/07/20 0459 04/08/20 0739  FERRITIN 1,443* 1,590*    Coagulation profile No results for input(s): INR, PROTIME in the last 168 hours.  Recent Labs    04/07/20 0459 04/08/20 0739  DDIMER 0.73* 0.66*    Cardiac Enzymes No results for input(s): CKMB, TROPONINI, MYOGLOBIN in the last 168 hours.  Invalid input(s): CK ------------------------------------------------------------------------------------------------------------------ No results found for: BNP  Micro Results No results found for this or any previous visit (from the past 240 hour(s)).  Radiology Reports DG Chest Portable 1 View  Result Date: 04/06/2020 CLINICAL DATA:  Shortness of breath. Positive COVID-19 EXAM: PORTABLE CHEST 1 VIEW COMPARISON:  04/14/2015 FINDINGS: The heart size and mediastinal contours are within normal limits. Low lung volumes. Extensive multifocal bilateral airspace opacities. No large pleural fluid collection or pneumothorax. The visualized skeletal structures are unremarkable. IMPRESSION: Extensive multifocal bilateral airspace opacities compatible with multifocal pneumonia in the setting of COVID-19 infection. Electronically Signed    By: Duanne Guess D.O.   On: 04/06/2020 13:58

## 2020-04-08 NOTE — Plan of Care (Signed)

## 2020-04-09 LAB — D-DIMER, QUANTITATIVE: D-Dimer, Quant: 0.53 ug/mL-FEU — ABNORMAL HIGH (ref 0.00–0.50)

## 2020-04-09 LAB — CBC WITH DIFFERENTIAL/PLATELET
Abs Immature Granulocytes: 0.15 10*3/uL — ABNORMAL HIGH (ref 0.00–0.07)
Basophils Absolute: 0 10*3/uL (ref 0.0–0.1)
Basophils Relative: 0 %
Eosinophils Absolute: 0 10*3/uL (ref 0.0–0.5)
Eosinophils Relative: 0 %
HCT: 45.8 % (ref 39.0–52.0)
Hemoglobin: 14.6 g/dL (ref 13.0–17.0)
Immature Granulocytes: 2 %
Lymphocytes Relative: 6 %
Lymphs Abs: 0.5 10*3/uL — ABNORMAL LOW (ref 0.7–4.0)
MCH: 26.9 pg (ref 26.0–34.0)
MCHC: 31.9 g/dL (ref 30.0–36.0)
MCV: 84.3 fL (ref 80.0–100.0)
Monocytes Absolute: 0.4 10*3/uL (ref 0.1–1.0)
Monocytes Relative: 4 %
Neutro Abs: 8.5 10*3/uL — ABNORMAL HIGH (ref 1.7–7.7)
Neutrophils Relative %: 88 %
Platelets: 384 10*3/uL (ref 150–400)
RBC: 5.43 MIL/uL (ref 4.22–5.81)
RDW: 13.9 % (ref 11.5–15.5)
WBC: 9.6 10*3/uL (ref 4.0–10.5)
nRBC: 0 % (ref 0.0–0.2)

## 2020-04-09 LAB — C-REACTIVE PROTEIN: CRP: 1.8 mg/dL — ABNORMAL HIGH (ref <1.0)

## 2020-04-09 LAB — COMPREHENSIVE METABOLIC PANEL
ALT: 77 U/L — ABNORMAL HIGH (ref 0–44)
AST: 43 U/L — ABNORMAL HIGH (ref 15–41)
Albumin: 2.5 g/dL — ABNORMAL LOW (ref 3.5–5.0)
Alkaline Phosphatase: 56 U/L (ref 38–126)
Anion gap: 9 (ref 5–15)
BUN: 21 mg/dL — ABNORMAL HIGH (ref 6–20)
CO2: 28 mmol/L (ref 22–32)
Calcium: 8.6 mg/dL — ABNORMAL LOW (ref 8.9–10.3)
Chloride: 105 mmol/L (ref 98–111)
Creatinine, Ser: 0.86 mg/dL (ref 0.61–1.24)
GFR calc Af Amer: >60 mL/min (ref >60)
GFR calc non Af Amer: >60 mL/min (ref >60)
Glucose, Bld: 128 mg/dL — ABNORMAL HIGH (ref 70–99)
Potassium: 4.2 mmol/L (ref 3.5–5.1)
Sodium: 142 mmol/L (ref 135–145)
Total Bilirubin: 0.8 mg/dL (ref 0.3–1.2)
Total Protein: 5.9 g/dL — ABNORMAL LOW (ref 6.5–8.1)

## 2020-04-09 LAB — MAGNESIUM: Magnesium: 2.3 mg/dL (ref 1.7–2.4)

## 2020-04-09 LAB — BRAIN NATRIURETIC PEPTIDE: B Natriuretic Peptide: 57 pg/mL (ref 0.0–100.0)

## 2020-04-09 LAB — PHOSPHORUS: Phosphorus: 5.2 mg/dL — ABNORMAL HIGH (ref 2.5–4.6)

## 2020-04-09 LAB — FERRITIN: Ferritin: 1124 ng/mL — ABNORMAL HIGH (ref 24–336)

## 2020-04-09 NOTE — Progress Notes (Signed)
PROGRESS NOTE                                                                                                                                                                                                             Patient Demographics:    Jeremy Miles, is a 39 y.o. male, DOB - 29-Apr-1981, TIR:[id number]  Outpatient Primary MD for the patient is Charlott Rakes, MD   Admit date - 04/06/2020   LOS - 3  Chief Complaint  Patient presents with  . Shortness of Breath       Brief Narrative: Patient is a 39 y.o. male with no past medical history-diagnosed with COVID-19 on 4/12 who presented with acute hypoxic respiratory failure secondary to COVID-19 pneumonia  Significant Events: 4/12>> COVID-19 positive at [facility] 4/20>> admit to [facility] for hypoxia secondary to COVID-19 requiring 2-3 L of oxygen 4/21>> worsening hypoxemia-requiring up to 5-6 L of oxygen  COVID-19 medications: Steroids: 4/20>> Remdesivir: 4/20>> Actemra: 4/21 x 1  Antibiotics: None  Microbiology data: None  DVT prophylaxis: SQ Lovenox  Procedures: None  Consults: None    Subjective:   Improved-down to just 2.5 L of oxygen this morning.   Assessment  & Plan :   Acute Hypoxic Resp Failure due to Covid 19 Viral pneumonia: Hypoxia has improved-down to 2.5 L of oxygen this morning.  Inflammatory markers have improved as well.  Continue steroids/remdesivir.  Suspect that if clinical improvement continues-should be able to discharge home in the next day or so.  Assess for home O2 requirement prior to discharge  Fever: afebrile  O2 requirements:  SpO2: 97 % O2 Flow Rate (L/min): 2.5 L/min   COVID-19 Labs: Recent Labs    04/07/20 0459 04/08/20 0739 04/09/20 0629  DDIMER 0.73* 0.66* 0.53*  FERRITIN 1,443* 1,590* 1,124*  CRP 11.4* 5.0* 1.8*       Component Value Date/Time   BNP 57.0 04/09/2020 0711    Recent Labs  Lab  04/06/20 1603  PROCALCITON 0.12    No results found for: SARSCOV2NAA   Prone/Incentive Spirometry: encouraged patient to lie prone for 3-4 hours at a time for a total of 16 hours a day, and to encourage incentive spirometry use 3-4/hour.  Transaminitis: Mild-likely second to COVID-19-follow.  Morbid Obesity: Estimated body mass index is 38.49 kg/m as calculated from the following:  Height as of this encounter: 5\' 11"  (1.803 m).   Weight as of this encounter: 125.2 kg.     ABG: No results found for: PHART, PCO2ART, PO2ART, HCO3, TCO2, ACIDBASEDEF, O2SAT  Vent Settings: N/A   Condition -Guarded  Family Communication  : Spoke with spouse over the phone on 4/23  Code Status :  Full Code  Diet :  Diet Order            Diet regular Room service appropriate? Yes; Fluid consistency: Thin  Diet effective now               Disposition Plan  :  Remain hospitalized-likely home on 4/24 with home O2 if clinical improvement continues  Barriers to discharge: Hypoxia requiring O2 supplementation/complete 5 days of IV Remdesivir  Antimicorbials  :    Anti-infectives (From admission, onward)   Start     Dose/Rate Route Frequency Ordered Stop   04/07/20 1000  remdesivir 100 mg in sodium chloride 0.9 % 100 mL IVPB     100 mg 200 mL/hr over 30 Minutes Intravenous Daily 04/06/20 1508 04/11/20 0959   04/06/20 1600  remdesivir 200 mg in sodium chloride 0.9% 250 mL IVPB     200 mg 580 mL/hr over 30 Minutes Intravenous Once 04/06/20 1508 04/06/20 1934      Inpatient Medications  Scheduled Meds: . enoxaparin (LOVENOX) injection  60 mg Subcutaneous Q24H  . methylPREDNISolone (SOLU-MEDROL) injection  40 mg Intravenous Q12H  . sodium chloride flush  3 mL Intravenous Q12H  . sodium chloride flush  3 mL Intravenous Q12H   Continuous Infusions: . sodium chloride    . remdesivir 100 mg in NS 100 mL 100 mg (04/09/20 0749)   PRN Meds:.sodium chloride, acetaminophen, albuterol,  bisacodyl, chlorpheniramine-HYDROcodone, guaiFENesin-dextromethorphan, lip balm, ondansetron **OR** ondansetron (ZOFRAN) IV, oxyCODONE, polyethylene glycol, sodium chloride, sodium chloride flush, sodium phosphate   Time Spent in minutes 25   See all Orders from today for further details   Oren Binet M.D on 04/09/2020 at 4:31 PM  To page go to [email or web address] - use universal password  [facility] -  Office  [phone]    Objective:   Vitals:   04/09/20 0800 04/09/20 1300 04/09/20 1313 04/09/20 1533  BP:   121/85   Pulse: 69  93   Resp: 19  19 20   Temp:  97.8 F (36.6 C) 97.8 F (36.6 C)   TempSrc:  Oral Oral   SpO2: 90%  94% 97%  Weight:      Height:        Wt Readings from Last 3 Encounters:  04/06/20 125.2 kg  04/14/15 129.3 kg     Intake/Output Summary (Last 24 hours) at 04/09/2020 1631 Last data filed at 04/08/2020 2300 Gross per 24 hour  Intake 320 ml  Output 400 ml  Net -80 ml     Physical Exam Gen Exam:Alert awake-not in any distress HEENT:atraumatic, normocephalic Chest: B/L clear to auscultation anteriorly CVS:S1S2 regular Abdomen:soft non tender, non distended Extremities:no edema Neurology: Non focal Skin: no rash   Data Review:    CBC Recent Labs  Lab 04/06/20 1133 04/06/20 1603 04/07/20 0459 04/08/20 0739 04/09/20 0629  WBC 5.7 5.3 3.5* 9.4 9.6  HGB 14.7 14.3 14.2 14.8 14.6  HCT 45.0 43.8 42.6 45.6 45.8  PLT 201 190 217 335 384  MCV 83.0 83.4 82.2 82.9 84.3  MCH 27.1 27.2 27.4 26.9 26.9  MCHC 32.7 32.6 33.3 32.5 31.9  RDW 13.8 13.7  13.4 13.5 13.9  LYMPHSABS  --  0.6* 0.4* 0.6* 0.5*  MONOABS  --  0.2 0.1 0.4 0.4  EOSABS  --  0.0 0.0 0.0 0.0  BASOSABS  --  0.0 0.0 0.0 0.0    Chemistries  Recent Labs  Lab 04/06/20 1133 04/06/20 1603 04/07/20 0459 04/08/20 0739 04/09/20 0629  NA 136  --  139 143 142  K 3.3*  --  3.7 3.8 4.2  CL 100  --  102 103 105  CO2 25  --  28 28 28   GLUCOSE 119*  --  137* 135* 128*   BUN 10  --  13 19 21*  CREATININE 0.85  --  0.86 0.89 0.86  CALCIUM 8.3*  --  8.4* 8.8* 8.6*  MG  --   --  2.4 2.3 2.3  AST  --  37 45* 33 43*  ALT  --  45* 53* 60* 77*  ALKPHOS  --  60 63 63 56  BILITOT  --  0.8 0.9 0.2* 0.8   ------------------------------------------------------------------------------------------------------------------ No results for input(s): CHOL, HDL, LDLCALC, TRIG, CHOLHDL, LDLDIRECT in the last 72 hours.  Lab Results  Component Value Date   HGBA1C 5.8 (H) 04/07/2020   ------------------------------------------------------------------------------------------------------------------ No results for input(s): TSH, T4TOTAL, T3FREE, THYROIDAB in the last 72 hours.  Invalid input(s): FREET3 ------------------------------------------------------------------------------------------------------------------ Recent Labs    04/08/20 0739 04/09/20 0629  FERRITIN 1,590* 1,124*    Coagulation profile No results for input(s): INR, PROTIME in the last 168 hours.  Recent Labs    04/08/20 0739 04/09/20 0629  DDIMER 0.66* 0.53*    Cardiac Enzymes No results for input(s): CKMB, TROPONINI, MYOGLOBIN in the last 168 hours.  Invalid input(s): CK ------------------------------------------------------------------------------------------------------------------    Component Value Date/Time   BNP 57.0 04/09/2020 0711    Micro Results No results found for this or any previous visit (from the past 240 hour(s)).  Radiology Reports DG Chest Portable 1 View  Result Date: 04/06/2020 CLINICAL DATA:  Shortness of breath. Positive COVID-19 EXAM: PORTABLE CHEST 1 VIEW COMPARISON:  04/14/2015 FINDINGS: The heart size and mediastinal contours are within normal limits. Low lung volumes. Extensive multifocal bilateral airspace opacities. No large pleural fluid collection or pneumothorax. The visualized skeletal structures are unremarkable. IMPRESSION: Extensive multifocal  bilateral airspace opacities compatible with multifocal pneumonia in the setting of COVID-19 infection. Electronically Signed   By: 04/16/2015 D.O.   On: 04/06/2020 13:58

## 2020-04-09 NOTE — Progress Notes (Signed)
SATURATION QUALIFICATIONS: (This note is used to comply with regulatory documentation for home oxygen)  Patient Saturations on Room Air at Rest = 95%  Patient Saturations on Room Air while Ambulating = 82%  Patient Saturations on 4 Liters of oxygen while Ambulating = 92%

## 2020-04-10 DIAGNOSIS — U071 COVID-19: Principal | ICD-10-CM

## 2020-04-10 LAB — MAGNESIUM: Magnesium: 2.2 mg/dL (ref 1.7–2.4)

## 2020-04-10 LAB — CBC WITH DIFFERENTIAL/PLATELET
Abs Immature Granulocytes: 0.3 10*3/uL — ABNORMAL HIGH (ref 0.00–0.07)
Basophils Absolute: 0 10*3/uL (ref 0.0–0.1)
Basophils Relative: 0 %
Eosinophils Absolute: 0 10*3/uL (ref 0.0–0.5)
Eosinophils Relative: 0 %
HCT: 44.7 % (ref 39.0–52.0)
Hemoglobin: 14.4 g/dL (ref 13.0–17.0)
Immature Granulocytes: 3 %
Lymphocytes Relative: 7 %
Lymphs Abs: 0.6 10*3/uL — ABNORMAL LOW (ref 0.7–4.0)
MCH: 27.3 pg (ref 26.0–34.0)
MCHC: 32.2 g/dL (ref 30.0–36.0)
MCV: 84.7 fL (ref 80.0–100.0)
Monocytes Absolute: 0.4 10*3/uL (ref 0.1–1.0)
Monocytes Relative: 4 %
Neutro Abs: 8.2 10*3/uL — ABNORMAL HIGH (ref 1.7–7.7)
Neutrophils Relative %: 86 %
Platelets: 376 10*3/uL (ref 150–400)
RBC: 5.28 MIL/uL (ref 4.22–5.81)
RDW: 13.7 % (ref 11.5–15.5)
WBC: 9.5 10*3/uL (ref 4.0–10.5)
nRBC: 0 % (ref 0.0–0.2)

## 2020-04-10 LAB — COMPREHENSIVE METABOLIC PANEL
ALT: 139 U/L — ABNORMAL HIGH (ref 0–44)
AST: 68 U/L — ABNORMAL HIGH (ref 15–41)
Albumin: 2.4 g/dL — ABNORMAL LOW (ref 3.5–5.0)
Alkaline Phosphatase: 57 U/L (ref 38–126)
Anion gap: 7 (ref 5–15)
BUN: 21 mg/dL — ABNORMAL HIGH (ref 6–20)
CO2: 25 mmol/L (ref 22–32)
Calcium: 8.3 mg/dL — ABNORMAL LOW (ref 8.9–10.3)
Chloride: 109 mmol/L (ref 98–111)
Creatinine, Ser: 0.74 mg/dL (ref 0.61–1.24)
GFR calc Af Amer: >60 mL/min (ref >60)
GFR calc non Af Amer: >60 mL/min (ref >60)
Glucose, Bld: 144 mg/dL — ABNORMAL HIGH (ref 70–99)
Potassium: 4.3 mmol/L (ref 3.5–5.1)
Sodium: 141 mmol/L (ref 135–145)
Total Bilirubin: 0.8 mg/dL (ref 0.3–1.2)
Total Protein: 5.7 g/dL — ABNORMAL LOW (ref 6.5–8.1)

## 2020-04-10 LAB — FERRITIN: Ferritin: 1274 ng/mL — ABNORMAL HIGH (ref 24–336)

## 2020-04-10 LAB — D-DIMER, QUANTITATIVE: D-Dimer, Quant: 0.43 ug/mL-FEU (ref 0.00–0.50)

## 2020-04-10 LAB — C-REACTIVE PROTEIN: CRP: 1 mg/dL — ABNORMAL HIGH (ref <1.0)

## 2020-04-10 LAB — PHOSPHORUS: Phosphorus: 3.9 mg/dL (ref 2.5–4.6)

## 2020-04-10 MED ORDER — ALBUTEROL SULFATE HFA 108 (90 BASE) MCG/ACT IN AERS: 2.0000 | INHALATION_SPRAY | Freq: Four times a day (QID) | RESPIRATORY_TRACT | 0 refills | Status: DC | PRN

## 2020-04-10 MED ORDER — METHYLPREDNISOLONE SODIUM SUCC 40 MG IJ SOLR
40.0000 mg | Freq: Every day | INTRAMUSCULAR | Status: DC
  Administered 2020-04-10: 40 mg via INTRAVENOUS
  Filled 2020-04-10: qty 1

## 2020-04-10 NOTE — Discharge Instructions (Signed)
Follow with Primary MD Maryella Shivers, MD in 7 days   Get CBC, CMP, 2 view Chest X ray -  checked next visit within 1 week by Primary MD  decision)  Activity: As tolerated with Full fall precautions use walker/cane & assistance as needed  Disposition Home    Diet: Heart Healthy    Special Instructions: If you have smoked or chewed Tobacco  in the last 2 yrs please stop smoking, stop any regular Alcohol  and or any Recreational drug use.  On your next visit with your primary care physician please Get Medicines reviewed and adjusted.  Please request your Prim.MD to go over all Hospital Tests and Procedure/Radiological results at the follow up, please get all Hospital records sent to your Prim MD by signing hospital release before you go home.  If you experience worsening of your admission symptoms, develop shortness of breath, life threatening emergency, suicidal or homicidal thoughts you must seek medical attention immediately by calling 911 or calling your MD immediately  if symptoms less severe.  You Must read complete instructions/literature along with all the possible adverse reactions/side effects for all the Medicines you take and that have been prescribed to you. Take any new Medicines after you have completely understood and accpet all the possible adverse reactions/side effects.          Person Under Monitoring Name: Jeremy Miles  Location: [address] [place] [address]   Infection Prevention Recommendations for Individuals Confirmed to have, or Being Evaluated for, 2019 Novel Coronavirus (COVID-19) Infection Who Receive Care at Home  Individuals who are confirmed to have, or are being evaluated for, COVID-19 should follow the prevention steps below until a healthcare provider or local or state health department says they can return to normal activities.  Stay home except to get medical care You should restrict activities outside your home, except for  getting medical care. Do not go to work, school, or public areas, and do not use public transportation or taxis.  Call ahead before visiting your doctor Before your medical appointment, call the healthcare provider and tell them that you have, or are being evaluated for, COVID-19 infection. This will help the healthcare provider's office take steps to keep other people from getting infected. Ask your healthcare provider to call the local or state health department.  Monitor your symptoms Seek prompt medical attention if your illness is worsening (e.g., difficulty breathing). Before going to your medical appointment, call the healthcare provider and tell them that you have, or are being evaluated for, COVID-19 infection. Ask your healthcare provider to call the local or state health department.  Wear a facemask You should wear a facemask that covers your nose and mouth when you are in the same room with other people and when you visit a healthcare provider. People who live with or visit you should also wear a facemask while they are in the same room with you.  Separate yourself from other people in your home As much as possible, you should stay in a different room from other people in your home. Also, you should use a separate bathroom, if available.  Avoid sharing household items You should not share dishes, drinking glasses, cups, eating utensils, towels, bedding, or other items with other people in your home. After using these items, you should wash them thoroughly with soap and water.  Cover your coughs and sneezes Cover your mouth and nose with a tissue when you cough or sneeze, or  you can cough or sneeze into your sleeve. Throw used tissues in a lined trash can, and immediately wash your hands with soap and water for at least 20 seconds or use an alcohol-based hand rub.  Wash your [facility] your hands often and thoroughly with soap and water for at least 20 seconds. You can use  an alcohol-based hand sanitizer if soap and water are not available and if your hands are not visibly dirty. Avoid touching your eyes, nose, and mouth with unwashed hands.   Prevention Steps for Caregivers and Household Members of Individuals Confirmed to have, or Being Evaluated for, COVID-19 Infection Being Cared for in the Home  If you live with, or provide care at home for, a person confirmed to have, or being evaluated for, COVID-19 infection please follow these guidelines to prevent infection:  Follow healthcare provider's instructions Make sure that you understand and can help the patient follow any healthcare provider instructions for all care.  Provide for the patient's basic needs You should help the patient with basic needs in the home and provide support for getting groceries, prescriptions, and other personal needs.  Monitor the patient's symptoms If they are getting sicker, call his or her medical provider and tell them that the patient has, or is being evaluated for, COVID-19 infection. This will help the healthcare provider's office take steps to keep other people from getting infected. Ask the healthcare provider to call the local or state health department.  Limit the number of people who have contact with the patient  If possible, have only one caregiver for the patient.  Other household members should stay in another home or place of residence. If this is not possible, they should stay  in another room, or be separated from the patient as much as possible. Use a separate bathroom, if available.  Restrict visitors who do not have an essential need to be in the home.  Keep older adults, very young children, and other sick people away from the patient Keep older adults, very young children, and those who have compromised immune systems or chronic health conditions away from the patient. This includes people with chronic heart, lung, or kidney conditions, diabetes,  and cancer.  Ensure good ventilation Make sure that shared spaces in the home have good air flow, such as from an air conditioner or an opened window, weather permitting.  Wash your hands often  Wash your hands often and thoroughly with soap and water for at least 20 seconds. You can use an alcohol based hand sanitizer if soap and water are not available and if your hands are not visibly dirty.  Avoid touching your eyes, nose, and mouth with unwashed hands.  Use disposable paper towels to dry your hands. If not available, use dedicated cloth towels and replace them when they become wet.  Wear a facemask and gloves  Wear a disposable facemask at all times in the room and gloves when you touch or have contact with the patient's blood, body fluids, and/or secretions or excretions, such as sweat, saliva, sputum, nasal mucus, vomit, urine, or feces.  Ensure the mask fits over your nose and mouth tightly, and do not touch it during use.  Throw out disposable facemasks and gloves after using them. Do not reuse.  Wash your hands immediately after removing your facemask and gloves.  If your personal clothing becomes contaminated, carefully remove clothing and launder. Wash your hands after handling contaminated clothing.  Place all used disposable facemasks,  gloves, and other waste in a lined container before disposing them with other household waste.  Remove gloves and wash your hands immediately after handling these items.  Do not share dishes, glasses, or other household items with the patient  Avoid sharing household items. You should not share dishes, drinking glasses, cups, eating utensils, towels, bedding, or other items with a patient who is confirmed to have, or being evaluated for, COVID-19 infection.  After the person uses these items, you should wash them thoroughly with soap and water.  Wash laundry thoroughly  Immediately remove and wash clothes or bedding that have blood,  body fluids, and/or secretions or excretions, such as sweat, saliva, sputum, nasal mucus, vomit, urine, or feces, on them.  Wear gloves when handling laundry from the patient.  Read and follow directions on labels of laundry or clothing items and detergent. In general, wash and dry with the warmest temperatures recommended on the label.  Clean all areas the individual has used often  Clean all touchable surfaces, such as counters, tabletops, doorknobs, bathroom fixtures, toilets, phones, keyboards, tablets, and bedside tables, every day. Also, clean any surfaces that may have blood, body fluids, and/or secretions or excretions on them.  Wear gloves when cleaning surfaces the patient has come in contact with.  Use a diluted bleach solution (e.g., dilute bleach with 1 part bleach and 10 parts water) or a household disinfectant with a label that says EPA-registered for coronaviruses. To make a bleach solution at home, add 1 tablespoon of bleach to 1 quart (4 cups) of water. For a larger supply, add  cup of bleach to 1 gallon (16 cups) of water.  Read labels of cleaning products and follow recommendations provided on product labels. Labels contain instructions for safe and effective use of the cleaning product including precautions you should take when applying the product, such as wearing gloves or eye protection and making sure you have good ventilation during use of the product.  Remove gloves and wash hands immediately after cleaning.  Monitor yourself for signs and symptoms of illness Caregivers and household members are considered close contacts, should monitor their health, and will be asked to limit movement outside of the home to the extent possible. Follow the monitoring steps for close contacts listed on the symptom monitoring form.   ? If you have additional questions, contact your local health department or call the epidemiologist on call at [phone] (available 24/7). ? This  guidance is subject to change. For the most up-to-date guidance from [facility], please refer to their website: [email or web address]hu

## 2020-04-10 NOTE — Discharge Summary (Signed)
Jeremy Miles:[id number] DOB: 12-29-1980 DOA: 04/06/2020  PCP: Jeremy Rakes, MD  Admit date: 04/06/2020  Discharge date: 04/10/2020  Admitted From: Home   Disposition:  Home   Recommendations for Outpatient Follow-up:   Follow up with PCP in 1-2 weeks  PCP Please obtain BMP/CBC, 2 view CXR in 1week,  (see Discharge instructions)   PCP Please follow up on the following pending results: Repeat CMP in 2 weeks.   Home Health: None   Equipment/Devices: None  Consultations: None  Discharge Condition: Stable    CODE STATUS: Full    Diet Recommendation: Heart Healthy   Diet Order            Diet - low sodium heart healthy        Diet regular Room service appropriate? Yes; Fluid consistency: Thin  Diet effective now               Chief Complaint  Patient presents with  . Shortness of Breath     Brief history of present illness from the day of admission and additional interim summary    Patient is a 39 y.o. male with no past medical history-diagnosed with COVID-19 on 4/12 who presented with acute hypoxic respiratory failure secondary to COVID-19 pneumonia  Significant Events: 4/12>> [facility]>> admit to [facility] for hypoxia secondary to COVID-19 requiring 2-3 L of oxygen 4/21>> worsening hypoxemia-requiring up to 5-6 L of oxygen  COVID-19 medications: Steroids: 4/20>> Remdesivir: 4/20>> Actemra: 4/21 x 1                                                                 Hospital Course    Acute Hypoxic Resp Failure due to Covid 19 Viral pneumonia: He had severe disease and was appropriately treated aggressively in the beginning with IV steroids, remdesivir and Actemra.  He was on 6 L nasal cannula oxygen with severe tachypnea, now down to room air and completely symptom-free.   Has finished his treatment course completely.  Will be discharged home with PCP follow-up.    Recent Labs  Lab 04/06/20 1133 04/06/20 1603 04/07/20 0459 04/08/20 0739 04/09/20 0629 04/09/20 0711 04/10/20 0256  NA 136  --  139 143 142  --  141  K 3.3*  --  3.7 3.8 4.2  --  4.3  CL 100  --  102 103 105  --  109  CO2 25  --  28 28 28   --  25  GLUCOSE 119*  --  137* 135* 128*  --  144*  BUN 10  --  13 19 21*  --  21*  CREATININE 0.85  --  0.86 0.89 0.86  --  0.74  CALCIUM 8.3*  --  8.4* 8.8* 8.6*  --  8.3*  AST  --  37 45* 33 43*  --  68*  ALT  --  45* 53* 60* 77*  --  139*  ALKPHOS  --  60 63 63 56  --  57  BILITOT  --  0.8 0.9 0.2* 0.8  --  0.8  ALBUMIN  --  2.6* 2.3* 2.6* 2.5*  --  2.4*  MG  --   --  2.4 2.3 2.3  --  2.2  CRP  --  10.1* 11.4* 5.0* 1.8*  --  1.0*  DDIMER  --  1.09* 0.73* 0.66* 0.53*  --  0.43  PROCALCITON  --  0.12  --   --   --   --   --   HGBA1C  --   --  5.8*  --   --   --   --   BNP  --   --   --   --   --  57.0  --       Transaminitis: Mild-likely second to COVID-19-follow.  Morbid Obesity: BMI of 38 follow with PCP for weight loss.     Discharge diagnosis     Active Problems:   Acute hypoxemic respiratory failure due to COVID-19 (HCC)   Obesity (BMI 35.0-39.9 without comorbidity)    Discharge instructions    Discharge Instructions    Diet - low sodium heart healthy   Complete by: As directed    Discharge instructions   Complete by: As directed    Follow with Primary MD Jeremy Shivers, MD in 7 days   Get CBC, CMP, 2 view Chest X ray -  checked next visit within 1 week by Primary MD  decision)  Activity: As tolerated with Full fall precautions use walker/cane & assistance as needed  Disposition Home    Diet: Heart Healthy    Special Instructions: If you have smoked or chewed Tobacco  in the last 2 yrs please stop smoking, stop any regular Alcohol  and or any Recreational drug use.  On your next visit with your primary care  physician please Get Medicines reviewed and adjusted.  Please request your Prim.MD to go over all Hospital Tests and Procedure/Radiological results at the follow up, please get all Hospital records sent to your Prim MD by signing hospital release before you go home.  If you experience worsening of your admission symptoms, develop shortness of breath, life threatening emergency, suicidal or homicidal thoughts you must seek medical attention immediately by calling 911 or calling your MD immediately  if symptoms less severe.  You Must read complete instructions/literature along with all the possible adverse reactions/side effects for all the Medicines you take and that have been prescribed to you. Take any new Medicines after you have completely understood and accpet all the possible adverse reactions/side effects.   Increase activity slowly   Complete by: As directed    MyChart COVID-19 home monitoring program   Complete by: Apr 10, 2020    Is the patient willing to use the [place] for home monitoring?: Yes   Temperature monitoring   Complete by: Apr 10, 2020    After how many days would you like to receive a notification of this patient's flowsheet entries?: 1      Discharge Medications   Allergies as of 04/10/2020   No Known Allergies     Medication List    TAKE these medications   acetaminophen 500 MG tablet Commonly known as: TYLENOL Take 500-1,000 mg by mouth every 6 (six)  hours as needed for mild pain, fever or headache.   albuterol 108 (90 Base) MCG/ACT inhaler Commonly known as: VENTOLIN HFA Inhale 2 puffs into the lungs every 6 (six) hours as needed for wheezing or shortness of breath.   pseudoephedrine 30 MG tablet Commonly known as: SUDAFED Take 30 mg by mouth every 4 (four) hours as needed for congestion.       Follow-up Information    Jeremy Rakes, MD. Schedule an appointment as soon as possible for a visit in 1 week(s).   Specialty: Family  Medicine Contact information: [address] [place] [address] [phone]           Major procedures and Radiology Reports - PLEASE review detailed and final reports thoroughly  -      DG Chest Portable 1 View  Result Date: 04/06/2020 CLINICAL DATA:  Shortness of breath. Positive COVID-19 EXAM: PORTABLE CHEST 1 VIEW COMPARISON:  04/14/2015 FINDINGS: The heart size and mediastinal contours are within normal limits. Low lung volumes. Extensive multifocal bilateral airspace opacities. No large pleural fluid collection or pneumothorax. The visualized skeletal structures are unremarkable. IMPRESSION: Extensive multifocal bilateral airspace opacities compatible with multifocal pneumonia in the setting of COVID-19 infection. Electronically Signed   By: Duanne Guess D.O.   On: 04/06/2020 13:58     Today   Subjective    Jeremy Miles today has no headache,no chest abdominal pain,no new weakness tingling or numbness, feels much better wants to go home today.     Objective   Blood pressure 110/64, pulse (!) 57, temperature 98.7 F (37.1 C), resp. rate 15, height 5\' 11"  (1.803 m), weight 125.2 kg, SpO2 93 %.   Intake/Output Summary (Last 24 hours) at 04/10/2020 1006 Last data filed at 04/09/2020 1856 Gross per 24 hour  Intake 360 ml  Output 550 ml  Net -190 ml    Exam  Awake Alert, Oriented x 3, No new F.N deficits, Normal affect [place].AT,PERRAL Supple Neck,No JVD, No cervical lymphadenopathy appriciated.  Symmetrical Chest wall movement, Good air movement bilaterally, CTAB RRR,No Gallops,Rubs or new Murmurs, No Parasternal Heave +ve B.Sounds, Abd Soft, Non tender, No organomegaly appriciated, No rebound -guarding or rigidity. No Cyanosis, Clubbing or edema, No new Rash or bruise   Data Review   CBC w Diff:  Lab Results  Component Value Date   WBC 9.5 04/10/2020   HGB 14.4 04/10/2020   HCT 44.7 04/10/2020   PLT 376 04/10/2020   LYMPHOPCT 7 04/10/2020    MONOPCT 4 04/10/2020   EOSPCT 0 04/10/2020   BASOPCT 0 04/10/2020    CMP:  Lab Results  Component Value Date   NA 141 04/10/2020   K 4.3 04/10/2020   CL 109 04/10/2020   CO2 25 04/10/2020   BUN 21 (H) 04/10/2020   CREATININE 0.74 04/10/2020   PROT 5.7 (L) 04/10/2020   ALBUMIN 2.4 (L) 04/10/2020   BILITOT 0.8 04/10/2020   ALKPHOS 57 04/10/2020   AST 68 (H) 04/10/2020   ALT 139 (H) 04/10/2020  .   Total Time in preparing paper work, data evaluation and todays exam - 35 minutes  04/12/2020 M.D on 04/10/2020 at 10:06 AM  [facility]   Office  [phone]

## 2020-04-10 NOTE — Progress Notes (Signed)
Patient ambulated in hallway approximately 100 feet while monitoring O2 saturation on room air. O2 sats fluctuated from 92 to 96%. Patient also speaking full sentences during walk test.

## 2021-01-19 IMAGING — DX DG CHEST 1V PORT
1 series · 1 of 1 positions shown · non-contrast
Comparison: 04/14/2015

CLINICAL DATA: Shortness of breath. Positive 3LD5G-JL

EXAM:
PORTABLE CHEST 1 VIEW

[chest ap]
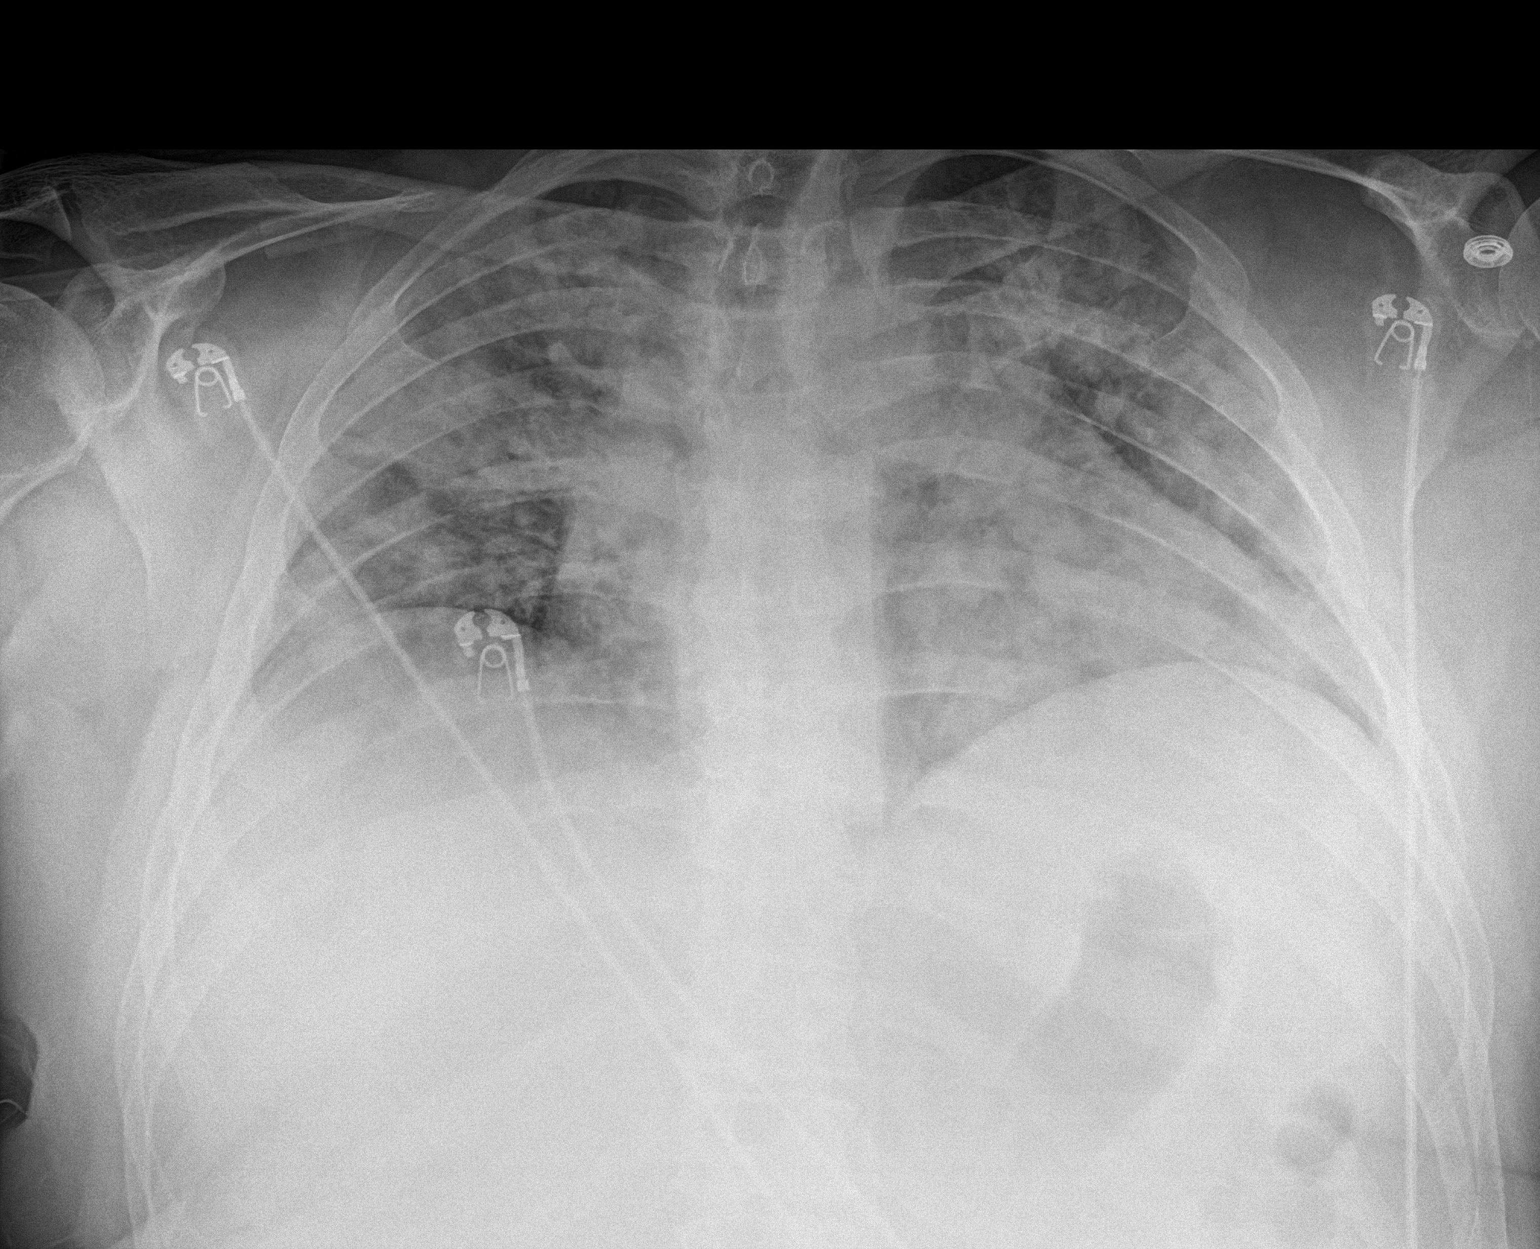

[1 of 1 positions shown; findings below may reference images not displayed]

FINDINGS: The heart size and mediastinal contours are within normal limits.
Low lung volumes. Extensive multifocal bilateral airspace opacities.
No large pleural fluid collection or pneumothorax. The visualized
skeletal structures are unremarkable.
IMPRESSION: Extensive multifocal bilateral airspace opacities compatible with
multifocal pneumonia in the setting of 3LD5G-JL infection.

## 2021-05-05 DIAGNOSIS — R079 Chest pain, unspecified: Secondary | ICD-10-CM

## 2023-01-22 ENCOUNTER — Ambulatory Visit: Payer: BC Managed Care – PPO | Attending: Internal Medicine | Admitting: Internal Medicine

## 2023-01-22 ENCOUNTER — Ambulatory Visit (INDEPENDENT_AMBULATORY_CARE_PROVIDER_SITE_OTHER): Payer: BC Managed Care – PPO

## 2023-01-22 ENCOUNTER — Encounter: Payer: Self-pay | Admitting: Internal Medicine

## 2023-01-22 VITALS — BP 170/100 | HR 96 | Ht 71.0 in | Wt 313.6 lb

## 2023-01-22 DIAGNOSIS — R55 Syncope and collapse: Secondary | ICD-10-CM

## 2023-01-22 NOTE — Addendum Note (Signed)
Addended by: Rexanne Mano B on: 01/22/2023 11:54 AM   Modules accepted: Orders

## 2023-01-22 NOTE — Progress Notes (Unsigned)
Enrolled for [employer] to mail a ZIO XT long term holter monitor to the patients address on file.  

## 2023-01-22 NOTE — Patient Instructions (Signed)
Medication Instructions:  No Changes In Medications at this time. ' *If you need a refill on your cardiac medications before your next appointment, please call your pharmacy*  Lab Work None Ordered At This Time.  If you have labs (blood work) drawn today and your tests are completely normal, you will receive your results only by: [place] (if you have MyChart) OR A paper copy in the mail If you have any lab test that is abnormal or we need to change your treatment, we will call you to review the results.  Testing/Procedures:  Jeremy Miles- Long Term Monitor Instructions   Your physician has requested you wear your ZIO patch monitor___7____days.   This is a single patch monitor.  [employer] supplies one patch monitor per enrollment.  Additional stickers are not available.   Please do not apply patch if you will be having a Nuclear Stress Test, Echocardiogram, Cardiac CT, MRI, or Chest Xray during the time frame you would be wearing the monitor. The patch cannot be worn during these tests.  You cannot remove and re-apply the ZIO XT patch monitor.   Your ZIO patch monitor will be sent USPS Priority mail from [facility] directly to your home address. The monitor may also be mailed to a PO BOX if home delivery is not available.   It may take 3-5 days to receive your monitor after you have been enrolled.   Once you have received you monitor, please review enclosed instructions.  Your monitor has already been registered assigning a specific monitor serial # to you.   Applying the monitor   Shave hair from upper left chest.   Hold abrader disc by orange tab.  Rub abrader in 40 strokes over left upper chest as indicated in your monitor instructions.   Clean area with 4 enclosed alcohol pads .  Use all pads to assure are is cleaned thoroughly.  Let dry.   Apply patch as indicated in monitor instructions.  Patch will be place under collarbone on left side of chest with arrow pointing  upward.   Rub patch adhesive wings for 2 minutes.Remove white label marked "1".  Remove white label marked "2".  Rub patch adhesive wings for 2 additional minutes.   While looking in a mirror, press and release button in center of patch.  A small green light will flash 3-4 times .  This will be your only indicator the monitor has been turned on.     Do not shower for the first 24 hours.  You may shower after the first 24 hours.   Press button if you feel a symptom. You will hear a small click.  Record Date, Time and Symptom in the Patient Log Book.   When you are ready to remove patch, follow instructions on last 2 pages of Patient Log Book.  Stick patch monitor onto last page of Patient Log Book.   Place Patient Log Book in [place] box.  Use locking tab on box and tape box closed securely.  The [employer] and [employer] has [employer] on it.  Please place in mailbox as soon as possible.  Your physician should have your test results approximately 7 days after the monitor has been mailed back to [facility].   Call [place] at [phone] if you have questions regarding your ZIO XT patch monitor.  Call them immediately if you see an orange light blinking on your monitor.   If your monitor falls off in less  than 4 days contact our Monitor department at [phone].  If your monitor becomes loose or falls off after 4 days call [employer] at [phone] for suggestions on securing your monitor.   Follow-Up: At [facility], you and your health needs are our priority.  As part of our continuing mission to provide you with exceptional heart care, we have created designated Provider Care Teams.  These Care Teams include your primary Cardiologist (physician) and Advanced Practice Providers (APPs -  Physician Assistants and Nurse Practitioners) who all work together to provide you with the care you need, when you need it.  Your next appointment:   AS NEEDED   Provider:    Janina Mayo, MD
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
## 2023-01-24 DIAGNOSIS — R55 Syncope and collapse: Secondary | ICD-10-CM | POA: Diagnosis not present

## 2023-03-16 ENCOUNTER — Other Ambulatory Visit: Payer: Self-pay
